# Patient Record
Sex: Male | Born: 1985 | Race: Black or African American | Hispanic: No | Marital: Single | Smoking: Current every day smoker
Health system: Southern US, Community
[De-identification: ages and names within clinical notes are randomized; demographics above are authoritative.]

## PROBLEM LIST (undated history)

## (undated) DIAGNOSIS — J45909 Unspecified asthma, uncomplicated: Secondary | ICD-10-CM

## (undated) DIAGNOSIS — F32A Depression, unspecified: Secondary | ICD-10-CM

## (undated) DIAGNOSIS — F329 Major depressive disorder, single episode, unspecified: Secondary | ICD-10-CM

---

## 2015-07-05 ENCOUNTER — Emergency Department
Admission: EM | Admit: 2015-07-05 | Discharge: 2015-07-05 | Disposition: A | Discharge status: Home / Self Care | Payer: BLUE CROSS/BLUE SHIELD | Attending: Emergency Medicine | Admitting: Emergency Medicine

## 2015-07-05 DIAGNOSIS — Y998 Other external cause status: Secondary | ICD-10-CM | POA: Diagnosis not present

## 2015-07-05 DIAGNOSIS — S29019A Strain of muscle and tendon of unspecified wall of thorax, initial encounter: Secondary | ICD-10-CM

## 2015-07-05 DIAGNOSIS — X58XXXA Exposure to other specified factors, initial encounter: Secondary | ICD-10-CM | POA: Diagnosis not present

## 2015-07-05 DIAGNOSIS — Y9289 Other specified places as the place of occurrence of the external cause: Secondary | ICD-10-CM | POA: Insufficient documentation

## 2015-07-05 DIAGNOSIS — S29002A Unspecified injury of muscle and tendon of back wall of thorax, initial encounter: Secondary | ICD-10-CM | POA: Diagnosis present

## 2015-07-05 DIAGNOSIS — Y9389 Activity, other specified: Secondary | ICD-10-CM | POA: Insufficient documentation

## 2015-07-05 DIAGNOSIS — S29012A Strain of muscle and tendon of back wall of thorax, initial encounter: Secondary | ICD-10-CM | POA: Insufficient documentation

## 2015-07-05 LAB — CBC WITH DIFFERENTIAL/PLATELET
BASOS ABS: 0.1 10*3/uL (ref 0–0.1)
BASOS PCT: 1 %
EOS ABS: 0.2 10*3/uL (ref 0–0.7)
EOS PCT: 2 %
HCT: 42.8 % (ref 40.0–52.0)
HEMOGLOBIN: 14.1 g/dL (ref 13.0–18.0)
LYMPHS PCT: 14 %
Lymphs Abs: 1.3 10*3/uL (ref 1.0–3.6)
MCH: 29.1 pg (ref 26.0–34.0)
MCHC: 33.1 g/dL (ref 32.0–36.0)
MCV: 87.9 fL (ref 80.0–100.0)
MONO ABS: 0.7 10*3/uL (ref 0.2–1.0)
MONOS PCT: 7 %
NEUTROS ABS: 7.1 10*3/uL — AB (ref 1.4–6.5)
NEUTROS PCT: 76 %
PLATELETS: 238 10*3/uL (ref 150–440)
RBC: 4.87 MIL/uL (ref 4.40–5.90)
RDW: 13.8 % (ref 11.5–14.5)
WBC: 9.5 10*3/uL (ref 3.8–10.6)

## 2015-07-05 LAB — BASIC METABOLIC PANEL
Anion gap: 4 — ABNORMAL LOW (ref 5–15)
BUN: 10 mg/dL (ref 6–20)
CHLORIDE: 107 mmol/L (ref 101–111)
CO2: 27 mmol/L (ref 22–32)
CREATININE: 0.85 mg/dL (ref 0.61–1.24)
Calcium: 8.9 mg/dL (ref 8.9–10.3)
GFR calc non Af Amer: >60 mL/min (ref >60)
Glucose, Bld: 108 mg/dL — ABNORMAL HIGH (ref 65–99)
POTASSIUM: 3.8 mmol/L (ref 3.5–5.1)
Sodium: 138 mmol/L (ref 135–145)

## 2015-07-05 LAB — TROPONIN I

## 2015-07-05 LAB — CK: Total CK: 141 U/L (ref 49–397)

## 2015-07-05 MED ORDER — OXYCODONE-ACETAMINOPHEN 5-325 MG PO TABS: 1.0000 | ORAL_TABLET | ORAL | Status: DC | PRN

## 2015-07-05 MED ORDER — OXYCODONE-ACETAMINOPHEN 5-325 MG PO TABS
1.0000 | ORAL_TABLET | Freq: Once | ORAL | Status: AC
  Administered 2015-07-05: 1 via ORAL
  Filled 2015-07-05: qty 1

## 2015-07-05 MED ORDER — DIAZEPAM 2 MG PO TABS
2.0000 mg | ORAL_TABLET | Freq: Once | ORAL | Status: AC
  Administered 2015-07-05: 2 mg via ORAL
  Filled 2015-07-05: qty 1

## 2015-07-05 MED ORDER — DIAZEPAM 2 MG PO TABS: 2.0000 mg | ORAL_TABLET | Freq: Three times a day (TID) | ORAL | Status: DC | PRN

## 2015-07-05 MED ORDER — KETOROLAC TROMETHAMINE 30 MG/ML IJ SOLN
60.0000 mg | Freq: Once | INTRAMUSCULAR | Status: AC
  Administered 2015-07-05: 60 mg via INTRAMUSCULAR
  Filled 2015-07-05: qty 2

## 2015-07-05 NOTE — ED Notes (Signed)
Pt reports back pain since Saturday. He states that he went to hospital in [facility] yesterday and was given pain meds. Awakened at 2AM with severe pain. Describes pain as pressure and squeezing, 10/10. Pt alert & oriented, resting quietly. NAD noted.

## 2015-07-05 NOTE — ED Notes (Signed)
Pt in with co back pain for few days,denies injury.  Was seen at [facility] for the same and had negative xrays.  Was given naproxen without relief.

## 2015-07-05 NOTE — Discharge Instructions (Signed)
1. Fill your naproxen prescription. 2. You may take medicines as needed for pain and muscle spasms (Percocet/Valium #10). 3. Return to the ER for worsening symptoms, persistent vomiting, difficulty breathing, numbness/tingling or other concerns.  Thoracic Strain A thoracic strain, which is sometimes called a mid-back strain, is an injury to the muscles or tendons that attach to the upper part of your back behind your chest. This type of injury occurs when a muscle is overstretched or overloaded.  Thoracic strains can range from mild to severe. Mild strains may involve stretching a muscle or tendon without tearing it. These injuries may heal in 1-2 weeks. More severe strains involve tearing of muscle fibers or tendons. These will cause more pain and may take 6-8 weeks to heal. CAUSES This condition may be caused by:  An injury in which a sudden force is placed on the muscle.  Exercising without properly warming up.  Overuse of the muscle.  Improper form during certain movements.  Other injuries that surround or cause stress on the mid-back, causing a strain on the muscles. In some cases, the cause may not be known. RISK FACTORS This injury is more common in:  Athletes.  People with obesity. SYMPTOMS The main symptom of this condition is pain, especially with movement. Other symptoms include:  Bruising.  Swelling.  Spasm. DIAGNOSIS This condition may be diagnosed with a physical exam. X-rays may be taken to check for a fracture. TREATMENT This condition may be treated with:  Resting and icing the injured area.  Physical therapy. This will involve doing stretching and strengthening exercises.  Medicines for pain and inflammation. HOME CARE INSTRUCTIONS  Rest as needed. Follow instructions from your health care provider about any restrictions on activity.  If directed, apply ice to the injured area:  Put ice in a plastic bag.  Place a towel between your skin and the  bag.  Leave the ice on for 20 minutes, 2-3 times per day.  Take over-the-counter and prescription medicines only as told by your health care provider.  Begin doing exercises as told by your health care provider or physical therapist.  Always warm up properly before physical activity or sports.  Bend your knees before you lift heavy objects.  Keep all follow-up visits as told by your health care provider. This is important. SEEK MEDICAL CARE IF:  Your pain is not helped by medicine.  Your pain, bruising, or swelling is getting worse.  You have a fever. SEEK IMMEDIATE MEDICAL CARE IF:  You have shortness of breath.  You have chest pain.  You develop numbness or weakness in your legs.  You have involuntary loss of urine (urinary incontinence).   This information is not intended to replace advice given to you by your health care provider. Make sure you discuss any questions you have with your health care provider.   Document Released: 07/13/2003 Document Revised: 01/11/2015 Document Reviewed: 06/16/2014 Elsevier Interactive Patient Education [employer]

## 2015-07-05 NOTE — ED Notes (Addendum)
Pt discharged home after verbalizing understanding of discharge instructions; nad noted.  Pt did not drive; to be picked up by family member

## 2015-07-05 NOTE — ED Provider Notes (Signed)
[facility] Emergency Department Provider Note  ____________________________________________  Time seen: Approximately 6:41 AM  I have reviewed the triage vital signs and the nursing notes.   HISTORY  Chief Complaint Back Pain    HPI Roger Hall is a 30 y.o. male who presents to the ED from home with a chief complaint of upper back pain. Patient complains of a 4 to 5 day history ofthoracic back pain. Denies recent travel or trauma. He was seen at [facility] yesterday morning for the same complaint. States he had x-rays and given a shot of pain medicine and prescribed naproxen. Has not yet filled this prescription. He was unable to sleep this morning secondary to pain and muscle spasms in his upper back. Symptoms are not associated with numbness/tingling, arm weakness, chest pain, shortness of breath. Also denies bowel or bladder incontinence. Denies recent fever, chills, cough, congestion, abdominal pain, nausea, vomiting, diarrhea. Nothing makes his pain better. Movement makes his pain worse. He did note last week that his hands and fingers "drawled up" and it took a few minutes for them to relax. He does admit to cocaine use; none in the past 24 hours. Denies hormone use.  Past medical history None   There are no active problems to display for this patient.   No past surgical history on file.  No current outpatient prescriptions on file.  Allergies Review of patient's allergies indicates no known allergies.  No family history on file.  Social History Social History  Substance Use Topics  . Smoking status: Not on file  . Smokeless tobacco: Not on file  . Alcohol Use: Not on file  Smoker  Cocaine use  Review of Systems  Constitutional: No fever/chills. Eyes: No visual changes. ENT: No sore throat. Cardiovascular: Denies chest pain. Respiratory: Denies shortness of breath. Gastrointestinal: No abdominal pain.  No nausea, no vomiting.   No diarrhea.  No constipation. Genitourinary: Negative for dysuria. Musculoskeletal:  Positive for back pain. Skin: Negative for rash. Neurological: Negative for headaches, focal weakness or numbness.  10-point ROS otherwise negative.  ____________________________________________   PHYSICAL EXAM:  VITAL SIGNS: ED Triage Vitals  Enc Vitals Group     BP 07/05/15 0318 120/73 mmHg     Pulse Rate 07/05/15 0318 79     Resp 07/05/15 0318 18     Temp 07/05/15 0318 98.2 F (36.8 C)     Temp Source 07/05/15 0318 Oral     SpO2 07/05/15 0318 100 %     Weight 07/05/15 0318 160 lb (72.576 kg)     Height 07/05/15 0318  (1.6 m)     Head Cir --      Peak Flow --      Pain Score 07/05/15 0318 10     Pain Loc --      Pain Edu? --      Excl. in GC? --     Constitutional: Asleep, easily awakened for exam. Alert and oriented. Well appearing and in no acute distress. Eyes: Conjunctivae are normal. PERRL. EOMI. Head: Atraumatic. Nose: No congestion/rhinnorhea. Mouth/Throat: Mucous membranes are moist.  Oropharynx non-erythematous. Neck: No stridor.  No cervical spine tenderness to palpation. Cardiovascular: Normal rate, regular rhythm. Grossly normal heart sounds.  Good peripheral circulation. Respiratory: Normal respiratory effort.  No retractions. Lungs CTAB. Gastrointestinal: Soft and nontender. No distention. No abdominal bruits. No CVA tenderness. Musculoskeletal: No spinal tenderness to palpation. Thoracic paraspinal muscle spasms, right greater than left. Pain on movement of trunk  and raising right shoulder.  Neurologic:  Normal speech and language. No gross focal neurologic deficits are appreciated. No gait instability. Skin:  Skin is warm, dry and intact. No rash noted. Psychiatric: Mood and affect are normal. Speech and behavior are normal.  ____________________________________________   LABS (all labs ordered are listed, but only abnormal results are displayed)  Labs  Reviewed  BASIC METABOLIC PANEL  CK   ____________________________________________ EKG None ___________________________________________  Radiology None ____________________________________________   PROCEDURES  Procedure(s) performed: None  Critical Care performed: No  ____________________________________________   INITIAL IMPRESSION / ASSESSMENT AND PLAN / ED COURSE  Pertinent labs & imaging results that were available during my care of the patient were reviewed by me and considered in my medical decision making (see chart for details).  30 year old male who presents with thoracic back pain, most likely musculoskeletal in nature. Given patient's complaint of hand cramping last week, coupled with cocaine use, will check electrolytes and ck. Will administer NSAIDs, analgesia and muscle relaxer.   ----------------------------------------- 8:11 AM on 07/05/2015 -----------------------------------------  Patient complained to nurse of chest pain. EKG was obtained which was unremarkable. Given patient's complaints of thoracic back pain, and now chest pain in the setting of cocaine use over 24 hours ago, will add a troponin. If this is negative, and patient's pain is improved, I anticipate he will be able to be discharged home with prescriptions for analgesia and muscle relaxers. I did search the patient on the [place] narcotics database and he does not have any entries. Overall feel patient's symptoms are suggestive of musculoskeletal strain. Low suspicion for ACS, PE, aortic dissection. Care transferred to Dr. Alphonzo Lemmings. ____________________________________________   FINAL CLINICAL IMPRESSION(S) / ED DIAGNOSES  Final diagnoses:  Thoracic myofascial strain, initial encounter      Irean Hong, MD 07/05/15 [phone]

## 2016-04-02 ENCOUNTER — Emergency Department
Admission: EM | Admit: 2016-04-02 | Discharge: 2016-04-02 | Disposition: A | Discharge status: Home / Self Care | Payer: BLUE CROSS/BLUE SHIELD | Attending: Emergency Medicine | Admitting: Emergency Medicine

## 2016-04-02 DIAGNOSIS — F17210 Nicotine dependence, cigarettes, uncomplicated: Secondary | ICD-10-CM | POA: Insufficient documentation

## 2016-04-02 DIAGNOSIS — J039 Acute tonsillitis, unspecified: Secondary | ICD-10-CM | POA: Insufficient documentation

## 2016-04-02 DIAGNOSIS — J069 Acute upper respiratory infection, unspecified: Secondary | ICD-10-CM | POA: Insufficient documentation

## 2016-04-02 HISTORY — DX: Depression, unspecified: F32.A

## 2016-04-02 HISTORY — DX: Major depressive disorder, single episode, unspecified: F32.9

## 2016-04-02 LAB — POCT RAPID STREP A: Streptococcus, Group A Screen (Direct): NEGATIVE

## 2016-04-02 MED ORDER — AMOXICILLIN 875 MG PO TABS: 875.0000 mg | ORAL_TABLET | Freq: Two times a day (BID) | ORAL | 0 refills | Status: DC

## 2016-04-02 MED ORDER — FLUTICASONE PROPIONATE 50 MCG/ACT NA SUSP: 2.0000 | Freq: Every day | NASAL | 0 refills | Status: DC

## 2016-04-02 MED ORDER — PSEUDOEPH-BROMPHEN-DM 30-2-10 MG/5ML PO SYRP: 10.0000 mL | ORAL_SOLUTION | Freq: Four times a day (QID) | ORAL | 0 refills | Status: DC | PRN

## 2016-04-02 NOTE — ED Triage Notes (Signed)
Pt c/o sore throat with bodyaches and sinus congestion since saturday

## 2016-04-02 NOTE — ED Provider Notes (Signed)
[facility] Emergency Department Provider Note  ____________________________________________  Time seen: Approximately 9:00 AM  I have reviewed the triage vital signs and the nursing notes.   HISTORY  Chief Complaint Sore Throat    HPI Roger Hall is a 30 y.o. male , NAD, presents to the nurse department with 2 day history of sore throat, ear pain, fatigue and body aches. Patient states symptoms began suddenly over the weekend.Has had increasing sore throat and feeling of swelling about his tonsils over the last 24 hours. Has not had any difficulty breathing or swallowing. Has had no shortness of breath or wheezing. Has not taken anything over-the-counter for his symptoms. Denies any sick contacts. Has had no chest pain, abdominal pain, nausea or vomiting. Denies drainage from ears but has had nasal congestion and runny nose with sinus headache.   Past Medical History:  Diagnosis Date  . Depression     There are no active problems to display for this patient.   History reviewed. No pertinent surgical history.  Prior to Admission medications   Medication Sig Start Date End Date Taking? Authorizing Provider  amoxicillin (AMOXIL) 875 MG tablet Take 1 tablet (875 mg total) by mouth 2 (two) times daily. 04/02/16   Jami L Hagler, PA-C  brompheniramine-pseudoephedrine-DM 30-2-10 MG/5ML syrup Take 10 mLs by mouth 4 (four) times daily as needed. 04/02/16   Jami L Hagler, PA-C  fluticasone (FLONASE) 50 MCG/ACT nasal spray Place 2 sprays into both nostrils daily. 04/02/16   Jami L Hagler, PA-C    Allergies Patient has no known allergies.  No family history on file.  Social History Social History  Substance Use Topics  . Smoking status: Current Every Day Smoker    Types: Cigarettes  . Smokeless tobacco: Never Used  . Alcohol use Yes     Review of Systems  Constitutional: Positive fatigue. No fevers, chills Eyes: No discharge ENT: Positive sore  throat, nasal congestion, ear pressure, runny nose. No ear drainage Cardiovascular: No chest pain. Respiratory: Positive nonproductive cough. No shortness of breath. No wheezing.  Gastrointestinal: No abdominal pain.  No nausea, vomiting.  Musculoskeletal: Positive for general myalgias.  Skin: Negative for rash. Neurological: Positive for sinus headaches, but no focal weakness or numbness. 10-point ROS otherwise negative.  ____________________________________________   PHYSICAL EXAM:  VITAL SIGNS: ED Triage Vitals  Enc Vitals Group     BP 04/02/16 0856 129/69     Pulse Rate 04/02/16 0856 78     Resp 04/02/16 0856 17     Temp 04/02/16 0856 98.5 F (36.9 C)     Temp Source 04/02/16 0856 Oral     SpO2 04/02/16 0856 99 %     Weight 04/02/16 0856 145 lb (65.8 kg)     Height 04/02/16 0856 5\' 7"  (1.702 m)     Head Circumference --      Peak Flow --      Pain Score 04/02/16 0857 6     Pain Loc --      Pain Edu? --      Excl. in GC? --      Constitutional: Alert and oriented. Well appearing and in no acute distress. Eyes: Conjunctivae are normal without icterus, injection or discharge. Head: Atraumatic. No tenderness to percussion of frontal and maxillary sinuses. ENT:      Ears: TMs visualized with mild serous effusion but no bulging, erythema or perforation.      Nose: Not congestion with trace rhinorrhea. Mild injection to  the turbinates.      Mouth/Throat: Mucous membranes are moist. Bilateral tonsils with moderate erythema, white exudate and mild swelling but uvula is midline. Posterior pharynx without swelling. Airway is patent. Neck: No stridor. Supple with full range of motion. Hematological/Lymphatic/Immunilogical: No cervical lymphadenopathy. Cardiovascular: Normal rate, regular rhythm. Normal S1 and S2.  Good peripheral circulation. Respiratory: Normal respiratory effort without tachypnea or retractions. Lungs CTAB with breath sounds noted in all lung fields. No wheeze,  rhonchi, rales. Neurologic:  Normal speech and language. No gross focal neurologic deficits are appreciated.  Skin:  Skin is warm, dry and intact. No rash noted. Psychiatric: Mood and affect are normal. Speech and behavior are normal. Patient exhibits appropriate insight and judgement.   ____________________________________________   LABS (all labs ordered are listed, but only abnormal results are displayed)  Labs Reviewed  CULTURE, GROUP A STREP [facility]  POCT RAPID STREP A   ____________________________________________  EKG  None ____________________________________________  RADIOLOGY  None ____________________________________________    PROCEDURES  Procedure(s) performed: None   Procedures   Medications - No data to display   ____________________________________________   INITIAL IMPRESSION / ASSESSMENT AND PLAN / ED COURSE  Pertinent labs & imaging results that were available during my care of the patient were reviewed by me and considered in my medical decision making (see chart for details).  Clinical Course     Patient's diagnosis is consistent with Acute tonsillitis with URI. Patient will be discharged home with prescriptions for amoxicillin, Bromfed-DM and Flonase to take as directed. May take Tylenol or ibuprofen as needed. [facility] if symptoms persist past this treatment course. Patient is given ED precautions to return to the ED for any worsening or new symptoms.    ____________________________________________  FINAL CLINICAL IMPRESSION(S) / ED DIAGNOSES  Final diagnoses:  Acute tonsillitis, unspecified etiology  Upper respiratory tract infection, unspecified type      NEW MEDICATIONS STARTED DURING THIS VISIT:  Discharge Medication List as of 04/02/2016  9:40 AM    START taking these medications   Details  amoxicillin (AMOXIL) 875 MG tablet Take 1 tablet (875 mg total) by mouth 2 (two) times  daily., Starting Tue 04/02/2016, Print    brompheniramine-pseudoephedrine-DM 30-2-10 MG/5ML syrup Take 10 mLs by mouth 4 (four) times daily as needed., Starting Tue 04/02/2016, Print    fluticasone (FLONASE) 50 MCG/ACT nasal spray Place 2 sprays into both nostrils daily., Starting Tue 04/02/2016, Print             Hope PigeonJami L Hagler, PA-C 04/02/16 1035    Sharman CheekPhillip Stafford, MD 04/03/16 [phone]

## 2016-04-02 NOTE — ED Notes (Signed)
Body aches  With fever/chills for 2 days   Afebrile on arrival to ED states is having increased sore throat this am

## 2016-04-03 LAB — CULTURE, GROUP A STREP (THRC)

## 2016-05-16 ENCOUNTER — Encounter: Payer: Self-pay | Admitting: Emergency Medicine

## 2016-05-16 ENCOUNTER — Emergency Department
Admission: EM | Admit: 2016-05-16 | Discharge: 2016-05-16 | Disposition: A | Discharge status: Home / Self Care | Payer: BLUE CROSS/BLUE SHIELD | Attending: Emergency Medicine | Admitting: Emergency Medicine

## 2016-05-16 DIAGNOSIS — M545 Low back pain, unspecified: Secondary | ICD-10-CM

## 2016-05-16 DIAGNOSIS — G8929 Other chronic pain: Secondary | ICD-10-CM | POA: Insufficient documentation

## 2016-05-16 DIAGNOSIS — F17210 Nicotine dependence, cigarettes, uncomplicated: Secondary | ICD-10-CM | POA: Insufficient documentation

## 2016-05-16 LAB — URINALYSIS, COMPLETE (UACMP) WITH MICROSCOPIC
BACTERIA UA: NONE SEEN
BILIRUBIN URINE: NEGATIVE
Glucose, UA: NEGATIVE mg/dL
Hgb urine dipstick: NEGATIVE
Ketones, ur: NEGATIVE mg/dL
LEUKOCYTES UA: NEGATIVE
Nitrite: NEGATIVE
Protein, ur: NEGATIVE mg/dL
Specific Gravity, Urine: 1.014 (ref 1.005–1.030)
pH: 6 (ref 5.0–8.0)

## 2016-05-16 LAB — CHLAMYDIA/NGC RT PCR (ARMC ONLY)
CHLAMYDIA TR: NOT DETECTED
N gonorrhoeae: NOT DETECTED

## 2016-05-16 MED ORDER — MELOXICAM 15 MG PO TABS: 15.0000 mg | ORAL_TABLET | Freq: Every day | ORAL | 0 refills | Status: AC

## 2016-05-16 NOTE — ED Notes (Signed)
See triage note   States he has been having lower back pain for several days w/o injury  Ambulates well to treatment room  Also states he is having some burning with urination

## 2016-05-16 NOTE — ED Provider Notes (Signed)
[facility] Emergency Department Provider Note  ____________________________________________  Time seen: Approximately 10:29 AM  I have reviewed the triage vital signs and the nursing notes.   HISTORY  Chief Complaint Back Pain    HPI Roger Hall is a 31 y.o. male presents to the emergency department with occasional burning with urination and low back pain. Patient states that symptoms started 1 week ago. Patient has a history of chronic back pain and states that his back pain always feels different. Patient states that he does not drink a lot of water. Patient has had urinary tract infections in the past. Patient denies past STDs. Patient is unsure if he was exposed to STD. Patient denies fever, abdominal pain, frequency, urgency, discharge.   Past Medical History:  Diagnosis Date  . Depression     There are no active problems to display for this patient.   History reviewed. No pertinent surgical history.  Prior to Admission medications   Medication Sig Start Date End Date Taking? Authorizing Provider  meloxicam (MOBIC) 15 MG tablet Take 1 tablet (15 mg total) by mouth daily. 05/16/16 05/26/16  Enid Derry, PA-C    Allergies Patient has no known allergies.  No family history on file.  Social History Social History  Substance Use Topics  . Smoking status: Current Every Day Smoker    Types: Cigarettes  . Smokeless tobacco: Never Used  . Alcohol use Yes     Review of Systems  Constitutional: No fever/chills ENT: No upper respiratory complaints. Cardiovascular: No chest pain. Respiratory: No SOB. Gastrointestinal: No abdominal pain.  No nausea, no vomiting.  Musculoskeletal: Negative for musculoskeletal pain. Skin: Negative for rash, abrasions, lacerations, ecchymosis. Neurological: Negative for headaches, numbness or tingling   ____________________________________________   PHYSICAL EXAM:  VITAL SIGNS: ED Triage Vitals  Enc  Vitals Group     BP 05/16/16 0917 133/81     Pulse Rate 05/16/16 0917 82     Resp 05/16/16 0917 18     Temp 05/16/16 0917 98.2 F (36.8 C)     Temp Source 05/16/16 0917 Oral     SpO2 05/16/16 0917 99 %     Weight 05/16/16 0917 145 lb (65.8 kg)     Height --      Head Circumference --      Peak Flow --      Pain Score 05/16/16 0918 3     Pain Loc --      Pain Edu? --      Excl. in GC? --      Constitutional: Alert and oriented. Well appearing and in no acute distress. Eyes: Conjunctivae are normal. PERRL. EOMI. Head: Atraumatic. ENT:      Ears:      Nose: No congestion/rhinnorhea.      Mouth/Throat: Mucous membranes are moist.  Neck: No stridor.   Cardiovascular: Normal rate, regular rhythm. Normal S1 and S2.  Good peripheral circulation. Respiratory: Normal respiratory effort without tachypnea or retractions. Lungs CTAB. Good air entry to the bases with no decreased or absent breath sounds. Gastrointestinal: Bowel sounds 4 quadrants. Soft and nontender to palpation. No guarding or rigidity. No palpable masses. No distention. No CVA tenderness. Musculoskeletal: Full range of motion to all extremities. No gross deformities appreciated. Patient is tender to palpation across the lumbar region. No tenderness to palpation over thoracic or lumbar spine. Neurologic:  Normal speech and language. No gross focal neurologic deficits are appreciated.  Skin:  Skin is warm, dry and  intact. No rash noted. Psychiatric: Mood and affect are normal. Speech and behavior are normal. Patient exhibits appropriate insight and judgement.   ____________________________________________   LABS (all labs ordered are listed, but only abnormal results are displayed)  Labs Reviewed  URINALYSIS, COMPLETE (UACMP) WITH MICROSCOPIC - Abnormal; Notable for the following:       Result Value   Color, Urine YELLOW (*)    APPearance CLEAR (*)    Squamous Epithelial / LPF 0-5 (*)    All other components within  normal limits  CHLAMYDIA/NGC RT PCR ([facility] ONLY)   ____________________________________________  EKG   ____________________________________________  RADIOLOGY   No results found.  ____________________________________________    PROCEDURES  Procedure(s) performed:    Procedures    Medications - No data to display   ____________________________________________   INITIAL IMPRESSION / ASSESSMENT AND PLAN / ED COURSE  Pertinent labs & imaging results that were available during my care of the patient were reviewed by me and considered in my medical decision making (see chart for details).  Review of the [place] CSRS was performed in accordance of the [facility] prior to dispensing any controlled drugs.  Clinical Course     Patient's diagnosis is consistent with chronic low back pain. Urinalysis negative for infection. Gonorrhea and chlamydia negative. Exam and vital signs are reassuring. Patient will be discharged home with prescriptions for meloxicam. Patient is to follow up with PCP as directed. Patient is given ED precautions to return to the ED for any worsening or new symptoms.     ____________________________________________  FINAL CLINICAL IMPRESSION(S) / ED DIAGNOSES  Final diagnoses:  Chronic bilateral low back pain without sciatica      NEW MEDICATIONS STARTED DURING THIS VISIT:  Discharge Medication List as of 05/16/2016 11:48 AM    START taking these medications   Details  meloxicam (MOBIC) 15 MG tablet Take 1 tablet (15 mg total) by mouth daily., Starting Thu 05/16/2016, Until Sun 05/26/2016, Print            This chart was dictated using voice recognition software/Dragon. Despite best efforts to proofread, errors can occur which can change the meaning. Any change was purely unintentional.    Enid DerryAshley Hosam Mcfetridge, PA-C 05/16/16 1317    Jennye MoccasinBrian S Quigley, MD 05/16/16 1321

## 2016-05-16 NOTE — ED Triage Notes (Addendum)
Pt with low back pain for over a week. Hx of chronic back pain.  Pt also wants to be checked for STD>

## 2016-06-24 ENCOUNTER — Emergency Department: Payer: Self-pay

## 2016-06-24 ENCOUNTER — Encounter: Payer: Self-pay | Admitting: *Deleted

## 2016-06-24 ENCOUNTER — Emergency Department
Admission: EM | Admit: 2016-06-24 | Discharge: 2016-06-24 | Disposition: A | Discharge status: Home / Self Care | Payer: Self-pay | Attending: Emergency Medicine | Admitting: Emergency Medicine

## 2016-06-24 DIAGNOSIS — F17210 Nicotine dependence, cigarettes, uncomplicated: Secondary | ICD-10-CM | POA: Insufficient documentation

## 2016-06-24 DIAGNOSIS — R202 Paresthesia of skin: Secondary | ICD-10-CM | POA: Insufficient documentation

## 2016-06-24 DIAGNOSIS — R0602 Shortness of breath: Secondary | ICD-10-CM | POA: Insufficient documentation

## 2016-06-24 DIAGNOSIS — R079 Chest pain, unspecified: Secondary | ICD-10-CM

## 2016-06-24 DIAGNOSIS — F3181 Bipolar II disorder: Secondary | ICD-10-CM | POA: Insufficient documentation

## 2016-06-24 DIAGNOSIS — F1910 Other psychoactive substance abuse, uncomplicated: Secondary | ICD-10-CM | POA: Insufficient documentation

## 2016-06-24 LAB — CBC
HEMATOCRIT: 47 % (ref 40.0–52.0)
Hemoglobin: 15.3 g/dL (ref 13.0–18.0)
MCH: 29 pg (ref 26.0–34.0)
MCHC: 32.5 g/dL (ref 32.0–36.0)
MCV: 89.1 fL (ref 80.0–100.0)
Platelets: 225 10*3/uL (ref 150–440)
RBC: 5.27 MIL/uL (ref 4.40–5.90)
RDW: 14.3 % (ref 11.5–14.5)
WBC: 8.2 10*3/uL (ref 3.8–10.6)

## 2016-06-24 LAB — BASIC METABOLIC PANEL
ANION GAP: 7 (ref 5–15)
BUN: 11 mg/dL (ref 6–20)
CO2: 27 mmol/L (ref 22–32)
Calcium: 9.4 mg/dL (ref 8.9–10.3)
Chloride: 104 mmol/L (ref 101–111)
Creatinine, Ser: 0.92 mg/dL (ref 0.61–1.24)
GFR calc Af Amer: >60 mL/min (ref >60)
GLUCOSE: 86 mg/dL (ref 65–99)
POTASSIUM: 3.6 mmol/L (ref 3.5–5.1)
Sodium: 138 mmol/L (ref 135–145)

## 2016-06-24 LAB — TROPONIN I: Troponin I: <0.03 ng/mL (ref <0.03)

## 2016-06-24 MED ORDER — ARIPIPRAZOLE 10 MG PO TABS
ORAL_TABLET | ORAL | Status: AC
  Administered 2016-06-24: 5 mg via ORAL
  Filled 2016-06-24: qty 1

## 2016-06-24 MED ORDER — ARIPIPRAZOLE 5 MG PO TABS
5.0000 mg | ORAL_TABLET | Freq: Once | ORAL | Status: AC
  Administered 2016-06-24: 5 mg via ORAL

## 2016-06-24 MED ORDER — PRAZOSIN HCL 1 MG PO CAPS: 1.0000 mg | ORAL_CAPSULE | Freq: Every day | ORAL | 0 refills | Status: DC

## 2016-06-24 MED ORDER — ARIPIPRAZOLE 5 MG PO TABS: 5.0000 mg | ORAL_TABLET | Freq: Every day | ORAL | 0 refills | Status: DC

## 2016-06-24 MED ORDER — ASPIRIN 81 MG PO CHEW: 81.0000 mg | CHEWABLE_TABLET | Freq: Every day | ORAL | 0 refills | Status: DC

## 2016-06-24 NOTE — ED Notes (Signed)
Pt reports that he was having chest pain and shortness of breath today while he was at work - c/o getting lightheaded and reports speech changes - c/o left arm "feeling funny" - at this time speech clear - continues to c/o chest pain - no shortness of breath observed - respirations are even and unlabored

## 2016-06-24 NOTE — ED Triage Notes (Addendum)
States chest pain for 1 week, states today the chest pain went to his back and felt weak, states SOB, awake and alert, states he used cocaine on 2/16, states he is also under a lot of stress with depression and anxiety

## 2016-06-24 NOTE — ED Provider Notes (Addendum)
[facility] Emergency Department Provider Note  ____________________________________________   First MD Initiated Contact with Patient 06/24/16 1830     (approximate)  I have reviewed the triage vital signs and the nursing notes.   HISTORY  Chief Complaint Chest Pain   HPI Roger Hall is a 31 y.o. male with a history of "manic depression" as well as post traumatic stress disorder and anxiety who is presenting to the emergency department with 1 week of chest pain. He says that the chest pain has been crampy and sharp and was radiating to his back today. He says that it lasted several hours today and was a 10 out of 10. He says the pain was worse when he was bending over to lift parts at work. However, he says that the parts are not heavy. Also complaining of shortness of breath. Denies the pain worsening with deep breathing. Says that over the past week that he is also been increasingly stressed and depressed and often does not have the energy to get out of bed in the morning. However, he denies any suicidal or homicidal ideation or hallucinations. He says that he is also been drinking heavily over the past 3-4 days and use cocaine 3 nights ago by snorting it. He says that it worsened today he felt like he was going to pass out and had numbness to his left side which is now resolved. He says that he has never had this for her episode before when he has been depressed but says that he has had body aches in the past.   Past Medical History:  Diagnosis Date  . Depression     There are no active problems to display for this patient.   History reviewed. No pertinent surgical history.  Prior to Admission medications   Not on File    Allergies Patient has no known allergies.  History reviewed. No pertinent family history.  Social History Social History  Substance Use Topics  . Smoking status: Current Every Day Smoker    Types: Cigarettes  . Smokeless  tobacco: Never Used  . Alcohol use Yes    Review of Systems Constitutional: No fever/chills Eyes: No visual changes. ENT: No sore throat. Cardiovascular:as above Respiratory: as above Gastrointestinal: No abdominal pain.  No nausea, no vomiting.  No diarrhea.  No constipation. Genitourinary: Negative for dysuria. Musculoskeletal: Negative for back pain. Skin: Negative for rash. Neurological: Negative for headaches, focal weakness  10-point ROS otherwise negative.  ____________________________________________   PHYSICAL EXAM:  VITAL SIGNS: ED Triage Vitals  Enc Vitals Group     BP 06/24/16 1817 123/75     Pulse Rate 06/24/16 1817 88     Resp 06/24/16 1817 18     Temp 06/24/16 1817 99.3 F (37.4 C)     Temp Source 06/24/16 1817 Oral     SpO2 06/24/16 1817 100 %     Weight 06/24/16 1816 153 lb (69.4 kg)     Height 06/24/16 1816 5\' 4"  (1.626 m)     Head Circumference --      Peak Flow --      Pain Score 06/24/16 1816 10     Pain Loc --      Pain Edu? --      Excl. in GC? --     Constitutional: Alert and oriented. Well appearing and in no acute distress. Eyes: Conjunctivae are normal. PERRL. EOMI. Head: Atraumatic. Nose: No congestion/rhinnorhea. Mouth/Throat: Mucous membranes are moist.  Neck: No stridor.   Cardiovascular: Normal rate, regular rhythm. Grossly normal heart sounds.  Good peripheral circulation With equal and bilateral radial as well as dorsalis pedis pulses. Chest pain reproducible with palpation over the sternum. Respiratory: Normal respiratory effort.  No retractions. Lungs CTAB. Gastrointestinal: Soft and nontender. No distention.  Musculoskeletal: No lower extremity tenderness nor edema.  No joint effusions. Neurologic:  Normal speech and language. No gross focal neurologic deficits are appreciated. No gait instability. Skin:  Skin is warm, dry and intact. No rash noted. Psychiatric: Slightly depressed mood.    ____________________________________________   LABS (all labs ordered are listed, but only abnormal results are displayed)  Labs Reviewed  BASIC METABOLIC PANEL  CBC  TROPONIN I   ____________________________________________  EKG  ED ECG REPORT I, Dahir Ayer,  Teena Irani, the attending physician, personally viewed and interpreted this ECG.   Date: 06/24/2016  EKG Time: 1817  Rate: 93  Rhythm: normal sinus rhythm  Axis: Rightward axis  Intervals: Normal  ST&T Change: No ST segment elevation or depression. No abnormal T-wave inversion.  ____________________________________________  RADIOLOGY  DG Chest 2 View (Final result)  Result time 06/24/16 18:44:56  Final result by Mitzi Hansen, MD (06/24/16 18:44:56)           Narrative:   CLINICAL DATA: 31 y/o M; chest pain.  EXAM: CHEST 2 VIEW  COMPARISON: None.  FINDINGS: The heart size and mediastinal contours are within normal limits. Both lungs are clear. The visualized skeletal structures are unremarkable.  IMPRESSION: No active cardiopulmonary disease.   Electronically Signed By: Mitzi Hansen M.D. On: 06/24/2016 18:44          ____________________________________________   PROCEDURES  Procedure(s) performed:   Procedures  Critical Care performed:   ____________________________________________   INITIAL IMPRESSION / ASSESSMENT AND PLAN / ED COURSE  Pertinent labs & imaging results that were available during my care of the patient were reviewed by me and considered in my medical decision making (see chart for details).  Patient says that he has been admitted 3 times to the psychiatric unit in the past at [facility]. He says that the last time was one year ago. We will proceed with psychiatric consult at this time as well as TTS consultation. The patient says that he is not on any meds at this time is been on Seroquel the past. Feel the patient's symptoms are likely  related to his depression as well as drinking and not resting/sleeping well at night. However, we will check a CAT scan because of his left-sided symptoms today work. His cardiac workup appears reassuring.    ----------------------------------------- 9:34 PM on 06/24/2016 -----------------------------------------   Patient resting comfortable with this time. Has been evaluated by psychiatry, Dr. Ermalinda Memos, who recommends Abilify 5 mg daily as well as prazosin 1 mg at bedtime. I discussed this plan with the patient who continues to deny any hallucinations, suicidal or homicidal ideation. He'll be following up as an outpatient with [facility]. Possible psychosomatic versus TIA symptoms. We'll start the patient on aspirin. We'll refer to primary care. A symptomatic at this time with a normal head CT.  ____________________________________________   FINAL CLINICAL IMPRESSION(S) / ED DIAGNOSES   Polysubstance abuse. Chest pain. Left sided numbness.    NEW MEDICATIONS STARTED DURING THIS VISIT:  New Prescriptions   No medications on file     Note:  This document was prepared using Dragon voice recognition software and may include unintentional dictation errors.  Also discussed stopping the patient's abuse  including his cocaine and alcohol. He says that he only does cocaine over so often and to be easier for him to stop the cocaine. We discussed the risks of using cocaine with heart attack and stroke. He is understanding willing to comply.    Myrna Blazeravid Matthew Hildegarde Dunaway, MD 06/24/16 2135    Myrna Blazeravid Matthew Jeyli Zwicker, MD 06/24/16 2139

## 2016-07-28 ENCOUNTER — Encounter: Payer: Self-pay | Admitting: Emergency Medicine

## 2016-07-28 ENCOUNTER — Emergency Department
Admission: EM | Admit: 2016-07-28 | Discharge: 2016-07-29 | Disposition: A | Discharge status: Home / Self Care | Payer: BLUE CROSS/BLUE SHIELD | Attending: Emergency Medicine | Admitting: Emergency Medicine

## 2016-07-28 DIAGNOSIS — F17200 Nicotine dependence, unspecified, uncomplicated: Secondary | ICD-10-CM | POA: Diagnosis present

## 2016-07-28 DIAGNOSIS — F1020 Alcohol dependence, uncomplicated: Secondary | ICD-10-CM | POA: Diagnosis present

## 2016-07-28 DIAGNOSIS — F329 Major depressive disorder, single episode, unspecified: Secondary | ICD-10-CM | POA: Insufficient documentation

## 2016-07-28 DIAGNOSIS — F4310 Post-traumatic stress disorder, unspecified: Secondary | ICD-10-CM | POA: Diagnosis present

## 2016-07-28 DIAGNOSIS — F22 Delusional disorders: Secondary | ICD-10-CM

## 2016-07-28 DIAGNOSIS — F32A Depression, unspecified: Secondary | ICD-10-CM

## 2016-07-28 DIAGNOSIS — F17210 Nicotine dependence, cigarettes, uncomplicated: Secondary | ICD-10-CM | POA: Insufficient documentation

## 2016-07-28 DIAGNOSIS — Z5181 Encounter for therapeutic drug level monitoring: Secondary | ICD-10-CM | POA: Insufficient documentation

## 2016-07-28 LAB — URINALYSIS, COMPLETE (UACMP) WITH MICROSCOPIC
Bacteria, UA: NONE SEEN
Bilirubin Urine: NEGATIVE
Glucose, UA: NEGATIVE mg/dL
Hgb urine dipstick: NEGATIVE
Ketones, ur: NEGATIVE mg/dL
Leukocytes, UA: NEGATIVE
Nitrite: NEGATIVE
PH: 8 (ref 5.0–8.0)
Protein, ur: NEGATIVE mg/dL
RBC / HPF: NONE SEEN RBC/hpf (ref 0–5)
SPECIFIC GRAVITY, URINE: 1.005 (ref 1.005–1.030)
SQUAMOUS EPITHELIAL / LPF: NONE SEEN
WBC, UA: NONE SEEN WBC/hpf (ref 0–5)

## 2016-07-28 LAB — CBC WITH DIFFERENTIAL/PLATELET
BASOS PCT: 1 %
Basophils Absolute: 0.1 10*3/uL (ref 0–0.1)
EOS PCT: 2 %
Eosinophils Absolute: 0.1 10*3/uL (ref 0–0.7)
HEMATOCRIT: 45.4 % (ref 40.0–52.0)
Hemoglobin: 15.2 g/dL (ref 13.0–18.0)
Lymphocytes Relative: 18 %
Lymphs Abs: 1.3 10*3/uL (ref 1.0–3.6)
MCH: 29.6 pg (ref 26.0–34.0)
MCHC: 33.5 g/dL (ref 32.0–36.0)
MCV: 88.4 fL (ref 80.0–100.0)
MONO ABS: 0.6 10*3/uL (ref 0.2–1.0)
MONOS PCT: 9 %
NEUTROS ABS: 5 10*3/uL (ref 1.4–6.5)
Neutrophils Relative %: 70 %
PLATELETS: 190 10*3/uL (ref 150–440)
RBC: 5.13 MIL/uL (ref 4.40–5.90)
RDW: 14.3 % (ref 11.5–14.5)
WBC: 7.1 10*3/uL (ref 3.8–10.6)

## 2016-07-28 LAB — URINE DRUG SCREEN, QUALITATIVE (ARMC ONLY)
AMPHETAMINES, UR SCREEN: NOT DETECTED
BARBITURATES, UR SCREEN: NOT DETECTED
BENZODIAZEPINE, UR SCRN: NOT DETECTED
COCAINE METABOLITE, UR ~~LOC~~: NOT DETECTED
Cannabinoid 50 Ng, Ur ~~LOC~~: NOT DETECTED
MDMA (ECSTASY) UR SCREEN: NOT DETECTED
METHADONE SCREEN, URINE: NOT DETECTED
Opiate, Ur Screen: NOT DETECTED
Phencyclidine (PCP) Ur S: NOT DETECTED
TRICYCLIC, UR SCREEN: NOT DETECTED

## 2016-07-28 LAB — COMPREHENSIVE METABOLIC PANEL
ALBUMIN: 4.1 g/dL (ref 3.5–5.0)
ALK PHOS: 42 U/L (ref 38–126)
ALT: 16 U/L — AB (ref 17–63)
AST: 20 U/L (ref 15–41)
Anion gap: 6 (ref 5–15)
BILIRUBIN TOTAL: 1 mg/dL (ref 0.3–1.2)
BUN: 12 mg/dL (ref 6–20)
CO2: 29 mmol/L (ref 22–32)
CREATININE: 1.1 mg/dL (ref 0.61–1.24)
Calcium: 9.5 mg/dL (ref 8.9–10.3)
Chloride: 102 mmol/L (ref 101–111)
GFR calc Af Amer: >60 mL/min (ref >60)
GLUCOSE: 81 mg/dL (ref 65–99)
Potassium: 4 mmol/L (ref 3.5–5.1)
Sodium: 137 mmol/L (ref 135–145)
TOTAL PROTEIN: 7.3 g/dL (ref 6.5–8.1)

## 2016-07-28 LAB — ETHANOL

## 2016-07-28 LAB — ACETAMINOPHEN LEVEL

## 2016-07-28 LAB — SALICYLATE LEVEL

## 2016-07-28 LAB — TSH: TSH: 0.743 u[IU]/mL (ref 0.350–4.500)

## 2016-07-28 MED ORDER — ACETAMINOPHEN 325 MG PO TABS
650.0000 mg | ORAL_TABLET | Freq: Four times a day (QID) | ORAL | Status: DC | PRN
  Administered 2016-07-28: 650 mg via ORAL
  Filled 2016-07-28: qty 2

## 2016-07-28 NOTE — ED Notes (Signed)
BEHAVIORAL HEALTH ROUNDING Patient sleeping: No. Patient alert and oriented: yes Behavior appropriate: Yes.  ; If no, describe:  Nutrition and fluids offered: yes Toileting and hygiene offered: Yes  Sitter present: q15 minute observations and security  monitoring Law enforcement present: Yes  ODS  

## 2016-07-28 NOTE — ED Provider Notes (Signed)
-----------------------------------------   10:42 PM on 07/28/2016 -----------------------------------------  Psychiatry on call recommends inpatient treatment and also feels that he does meet criteria for involuntary commitment and recommends placing him under IVC if he tries to leave.  At this point the patient does want treatment.  We will see if we can get him downstairs behavioral medicine.  The patient's nurse is checking with TTS.   Loleta Roseory Laurier Jasperson, MD 07/28/16 [phone]

## 2016-07-28 NOTE — ED Triage Notes (Signed)
Pt reports body aches and fatigue  For the last few weeks, pt denies fever

## 2016-07-28 NOTE — ED Notes (Signed)
BEHAVIORAL HEALTH ROUNDING  Patient sleeping: No.  Patient alert and oriented: yes  Behavior appropriate: Yes. ; If no, describe:  Nutrition and fluids offered: Yes  Toileting and hygiene offered: Yes  Sitter present: not applicable, Q 15 min safety rounds and observation.  Law enforcement present: Yes ODS  

## 2016-07-28 NOTE — ED Notes (Signed)
Report given to this Clinical research associatewriter by Silvano RuskAnn RN.  Patient to be placed in BHU 5.

## 2016-07-28 NOTE — ED Notes (Signed)
BEHAVIORAL HEALTH ROUNDING Patient sleeping: Yes.   Patient alert and oriented: eyes closed  Appears to be asleep Behavior appropriate: Yes.  ; If no, describe:  Nutrition and fluids offered: Yes  Toileting and hygiene offered: sleeping Sitter present: q 15 minute observations and security monitoring Law enforcement present: yes  ODS 

## 2016-07-28 NOTE — ED Notes (Signed)

## 2016-07-28 NOTE — ED Notes (Signed)
[facility]

## 2016-07-28 NOTE — ED Notes (Signed)
ENVIRONMENTAL ASSESSMENT  Potentially harmful objects out of patient reach: Yes.  Personal belongings secured: Yes.  Patient dressed in hospital provided attire only: Yes.  Plastic bags out of patient reach: Yes.  Patient care equipment (cords, cables, call bells, lines, and drains) shortened, removed, or accounted for: Yes.  Equipment and supplies removed from bottom of stretcher: Yes.  Potentially toxic materials out of patient reach: Yes.  Sharps container removed or out of patient reach: Yes.   BEHAVIORAL HEALTH ROUNDING  Patient sleeping: No.  Patient alert and oriented: yes  Behavior appropriate: Yes. ; If no, describe:  Nutrition and fluids offered: Yes  Toileting and hygiene offered: Yes  Sitter present: not applicable, Q 15 min safety rounds and observation.  Law enforcement present: Yes ODS  ED BHU PLACEMENT JUSTIFICATION  Is the patient under IVC or is there intent for IVC: No.  Is the patient medically cleared: Yes.  Is there vacancy in the ED BHU: Yes.  Is the population mix appropriate for patient: Yes.  Is the patient awaiting placement in inpatient or outpatient setting: Pending psych evaluation.  Has the patient had a psychiatric consult: Pending SOC at this time.  Survey of unit performed for contraband, proper placement and condition of furniture, tampering with fixtures in bathroom, shower, and each patient room: Yes. ; Findings: All clear  APPEARANCE/BEHAVIOR  calm, cooperative and adequate rapport can be established  NEURO ASSESSMENT  Orientation: time, place and person  Hallucinations: No.None noted (Hallucinations)  Speech: Normal  Gait: normal  RESPIRATORY ASSESSMENT  WNL  CARDIOVASCULAR ASSESSMENT  WNL  GASTROINTESTINAL ASSESSMENT  WNL  EXTREMITIES  WNL  PLAN OF CARE  Provide calm/safe environment. Vital signs assessed TID. ED BHU Assessment once each 12-hour shift. Collaborate with TTS daily or as condition indicates. Assure the ED provider has  rounded once each shift. Provide and encourage hygiene. Provide redirection as needed. Assess for escalating behavior; address immediately and inform ED provider.  Assess family dynamic and appropriateness for visitation as needed: Yes. ; If necessary, describe findings:  Educate the patient/family about BHU procedures/visitation: Yes. ; If necessary, describe findings: Pt is calm and cooperative at this time. Pt understanding and accepting of unit procedures/rules. Will continue to monitor with Q 15 min safety rounds and observation.

## 2016-07-28 NOTE — ED Notes (Signed)
[facility]
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
## 2016-07-28 NOTE — ED Provider Notes (Signed)
[facility] Emergency Department Provider Note  ____________________________________________   I have reviewed the triage vital signs and the nursing notes.   HISTORY  Chief Complaint Fatigue and Generalized Body Aches    HPI Roger Hall is a 31 y.o. male suffers from depression PTSD and had a priorsuicide attempt in the past states that over the last 3 weeks he has had anhedonia, decreased interest in doing things he is either sleeping all the time or he is having trouble sleeping, he is not hearing voices but he does get paranoid and he has had thoughts of hurting himself as recently as 3 days ago. That time he is taking by doing an overdose. He has not however taken an overdose. The patient states that he is worried that perhaps she has diabetes mother physical illness but his mother is worried about him and his depression. Patient did not volunteer this information about depression until he was asked but that he states he is very depressed. He is not able to stay with a 29-year-old son. Patient tearful and the exam. He is here therefore voluntarily for further assessment     Past Medical History:  Diagnosis Date  . Depression     There are no active problems to display for this patient.   History reviewed. No pertinent surgical history.  Prior to Admission medications   Medication Sig Start Date End Date Taking? Authorizing Provider  ARIPiprazole (ABILIFY) 5 MG tablet Take 1 tablet (5 mg total) by mouth daily. 06/24/16   Myrna Blazeravid Matthew Schaevitz, MD  aspirin (ASPIRIN CHILDRENS) 81 MG chewable tablet Chew 1 tablet (81 mg total) by mouth daily. 06/24/16 06/24/17  Myrna Blazeravid Matthew Schaevitz, MD  prazosin (MINIPRESS) 1 MG capsule Take 1 capsule (1 mg total) by mouth at bedtime. 06/24/16   Myrna Blazeravid Matthew Schaevitz, MD    Allergies Patient has no known allergies.  No family history on file.  Social History Social History  Substance Use Topics  . Smoking  status: Current Every Day Smoker    Types: Cigarettes  . Smokeless tobacco: Never Used  . Alcohol use Yes    Review of Systems Constitutional: No fever/chills Eyes: No visual changes. ENT: No sore throat. No stiff neck no neck pain Cardiovascular: Denies chest pain. Respiratory: Denies shortness of breath. Gastrointestinal:   no vomiting.  No diarrhea.  No constipation. Genitourinary: Negative for dysuria. Musculoskeletal: Negative lower extremity swelling Skin: Negative for rash. Neurological: Negative for severe headaches, focal weakness or numbness. 10-point ROS otherwise negative.  ____________________________________________   PHYSICAL EXAM:  VITAL SIGNS: ED Triage Vitals  Enc Vitals Group     BP 07/28/16 1249 119/66     Pulse Rate 07/28/16 1249 81     Resp 07/28/16 1249 18     Temp 07/28/16 1249 98.6 F (37 C)     Temp Source 07/28/16 1249 Oral     SpO2 07/28/16 1249 100 %     Weight 07/28/16 1247 153 lb (69.4 kg)     Height 07/28/16 1247 5\' 4"  (1.626 m)     Head Circumference --      Peak Flow --      Pain Score --      Pain Loc --      Pain Edu? --      Excl. in GC? --     Constitutional: Alert and oriented. Well appearing and in no acute distress. Eyes: Conjunctivae are normal. PERRL. EOMI. Head: Atraumatic. Nose: No congestion/rhinnorhea. Mouth/Throat: Mucous  membranes are moist.  Oropharynx non-erythematous. Neck: No stridor.   Nontender with no meningismus Cardiovascular: Normal rate, regular rhythm. Grossly normal heart sounds.  Good peripheral circulation. Respiratory: Normal respiratory effort.  No retractions. Lungs CTAB. Abdominal: Soft and nontender. No distention. No guarding no rebound Back:  There is no focal tenderness or step off.  there is no midline tenderness there are no lesions noted. there is no CVA tenderness Musculoskeletal: No lower extremity tenderness, no upper extremity tenderness. No joint effusions, no DVT signs strong distal  pulses no edema Neurologic:  Normal speech and language. No gross focal neurologic deficits are appreciated.  Skin:  Skin is warm, dry and intact. No rash noted. Psychiatric: Mood and affect are Very depressed. Speech and behavior are normal.  ____________________________________________   LABS (all labs ordered are listed, but only abnormal results are displayed)  Labs Reviewed  BASIC METABOLIC PANEL  COMPREHENSIVE METABOLIC PANEL  ETHANOL  CBC WITH DIFFERENTIAL/PLATELET  URINALYSIS, COMPLETE (UACMP) WITH MICROSCOPIC  URINE DRUG SCREEN, QUALITATIVE ([facility] ONLY)  TSH  ACETAMINOPHEN LEVEL  SALICYLATE LEVEL   ____________________________________________  EKG  I personally interpreted any EKGs ordered by me or triage  ____________________________________________  RADIOLOGY  I reviewed any imaging ordered by me or triage that were performed during my shift and, if possible, patient and/or family made aware of any abnormal findings. ____________________________________________   PROCEDURES  Procedure(s) performed: None  Procedures  Critical Care performed: None  ____________________________________________   INITIAL IMPRESSION / ASSESSMENT AND PLAN / ED COURSE  Pertinent labs & imaging results that were available during my care of the patient were reviewed by me and considered in my medical decision making (see chart for details).  Patient sent to the macular area with generalized symptoms of malaise however he is actually suffering from pre-significant depression with suicidal thoughts a few days ago. Patient would benefit from psychiatric evaluation. We'll transfer therefore to the other side of the hospital emergency department where he can receive care compensatory to his pathology. At this time a low suspicion that is innate or organic pathology of a physical nature. More likely is depression causing all of his symptoms. Patient also has alcohol abuse. No evidence of  withdrawal.  Dr. Lamont Snowball assumes care of pt at this time.     ____________________________________________   FINAL CLINICAL IMPRESSION(S) / ED DIAGNOSES  Final diagnoses:  None      This chart was dictated using voice recognition software.  Despite best efforts to proofread,  errors can occur which can change meaning.      Jeanmarie Plant, MD 07/28/16 1331

## 2016-07-28 NOTE — ED Notes (Signed)
Report given to Amy T.

## 2016-07-29 ENCOUNTER — Encounter: Payer: Self-pay | Admitting: Psychiatry

## 2016-07-29 ENCOUNTER — Inpatient Hospital Stay
Admission: RE | Admit: 2016-07-29 | Discharge: 2016-08-02 | DRG: 885 | Disposition: A | Discharge status: Home / Self Care | Payer: No Typology Code available for payment source | Source: Intra-hospital | Attending: Psychiatry | Admitting: Psychiatry

## 2016-07-29 DIAGNOSIS — F4310 Post-traumatic stress disorder, unspecified: Secondary | ICD-10-CM | POA: Diagnosis present

## 2016-07-29 DIAGNOSIS — F17210 Nicotine dependence, cigarettes, uncomplicated: Secondary | ICD-10-CM | POA: Diagnosis present

## 2016-07-29 DIAGNOSIS — F314 Bipolar disorder, current episode depressed, severe, without psychotic features: Secondary | ICD-10-CM | POA: Diagnosis present

## 2016-07-29 DIAGNOSIS — F1020 Alcohol dependence, uncomplicated: Secondary | ICD-10-CM | POA: Diagnosis present

## 2016-07-29 DIAGNOSIS — F17200 Nicotine dependence, unspecified, uncomplicated: Secondary | ICD-10-CM | POA: Diagnosis present

## 2016-07-29 DIAGNOSIS — Z9119 Patient's noncompliance with other medical treatment and regimen: Secondary | ICD-10-CM

## 2016-07-29 DIAGNOSIS — F22 Delusional disorders: Secondary | ICD-10-CM | POA: Diagnosis present

## 2016-07-29 DIAGNOSIS — G4700 Insomnia, unspecified: Secondary | ICD-10-CM | POA: Diagnosis present

## 2016-07-29 DIAGNOSIS — F410 Panic disorder [episodic paroxysmal anxiety] without agoraphobia: Secondary | ICD-10-CM | POA: Diagnosis present

## 2016-07-29 DIAGNOSIS — R45851 Suicidal ideations: Secondary | ICD-10-CM | POA: Diagnosis present

## 2016-07-29 DIAGNOSIS — Z818 Family history of other mental and behavioral disorders: Secondary | ICD-10-CM

## 2016-07-29 DIAGNOSIS — F333 Major depressive disorder, recurrent, severe with psychotic symptoms: Secondary | ICD-10-CM

## 2016-07-29 MED ORDER — PRAZOSIN HCL 1 MG PO CAPS: 1.0000 mg | ORAL_CAPSULE | Freq: Every day | ORAL | Status: DC

## 2016-07-29 MED ORDER — ACETAMINOPHEN 325 MG PO TABS
650.0000 mg | ORAL_TABLET | Freq: Four times a day (QID) | ORAL | Status: DC | PRN
  Administered 2016-07-31: 650 mg via ORAL
  Filled 2016-07-29: qty 2

## 2016-07-29 MED ORDER — MAGNESIUM HYDROXIDE 400 MG/5ML PO SUSP: 30.0000 mL | Freq: Every day | ORAL | Status: DC | PRN

## 2016-07-29 MED ORDER — PRAZOSIN HCL 1 MG PO CAPS
1.0000 mg | ORAL_CAPSULE | Freq: Every day | ORAL | Status: DC
  Administered 2016-07-29: 1 mg via ORAL
  Filled 2016-07-29 (×3): qty 1

## 2016-07-29 MED ORDER — ARIPIPRAZOLE 5 MG PO TABS
5.0000 mg | ORAL_TABLET | Freq: Every day | ORAL | Status: DC
  Administered 2016-07-30 – 2016-08-02 (×4): 5 mg via ORAL
  Filled 2016-07-29 (×4): qty 1

## 2016-07-29 MED ORDER — PRAZOSIN HCL 1 MG PO CAPS
1.0000 mg | ORAL_CAPSULE | Freq: Two times a day (BID) | ORAL | Status: DC
  Administered 2016-07-30 – 2016-08-02 (×6): 1 mg via ORAL
  Filled 2016-07-29 (×7): qty 1

## 2016-07-29 MED ORDER — OXCARBAZEPINE 300 MG PO TABS
300.0000 mg | ORAL_TABLET | Freq: Two times a day (BID) | ORAL | Status: DC
  Administered 2016-07-29 – 2016-08-02 (×8): 300 mg via ORAL
  Filled 2016-07-29 (×8): qty 1

## 2016-07-29 MED ORDER — HYDROXYZINE HCL 50 MG PO TABS
50.0000 mg | ORAL_TABLET | Freq: Three times a day (TID) | ORAL | Status: DC | PRN
  Administered 2016-07-30: 50 mg via ORAL
  Filled 2016-07-29: qty 1

## 2016-07-29 MED ORDER — NICOTINE 21 MG/24HR TD PT24
21.0000 mg | MEDICATED_PATCH | Freq: Every day | TRANSDERMAL | Status: DC
  Administered 2016-07-30 – 2016-08-02 (×4): 21 mg via TRANSDERMAL
  Filled 2016-07-29 (×4): qty 1

## 2016-07-29 MED ORDER — TRAZODONE HCL 100 MG PO TABS
100.0000 mg | ORAL_TABLET | Freq: Every day | ORAL | Status: DC
  Administered 2016-07-29 – 2016-07-31 (×3): 100 mg via ORAL
  Filled 2016-07-29 (×4): qty 1

## 2016-07-29 MED ORDER — ARIPIPRAZOLE 5 MG PO TABS: 5.0000 mg | ORAL_TABLET | Freq: Every day | ORAL | Status: DC

## 2016-07-29 NOTE — BH Assessment (Signed)
[facility]. Spoke with Attending Physician, Dr. Jennet MaduroPucilowska, she will put the admission orders in. Attending Physician will be Dr. Jennet MaduroPucilowska.   [facility] [place].   Intake Paper Work has been signed and placed on patient chart.  ER staff is aware of the admission Rivka Barbara(Glenda, ER Sect.; Dr. Sharma CovertNorman, ER MD; Irish Lackuthie, Patient's Nurse & Josiah Lobolarissa, Patient Access).

## 2016-07-29 NOTE — ED Notes (Signed)

## 2016-07-29 NOTE — ED Provider Notes (Signed)
-----------------------------------------   8:00 AM on 07/29/2016 -----------------------------------------   Blood pressure 100/71, pulse 70, temperature 97.7 F (36.5 C), temperature source Oral, resp. rate 16, height 5\' 4"  (1.626 m), weight 153 lb (69.4 kg), SpO2 100 %.  The patient had no acute events since last update.  Calm and cooperative at this time.  Disposition is pending Psychiatry/Behavioral Medicine team recommendations.     Willy EddyPatrick Jasmia Angst, MD 07/29/16 0800

## 2016-07-29 NOTE — ED Notes (Signed)
Pt. To BHU from ED ambulatory without difficulty, to room  BHU5. Report from Ann RN. Pt. Is alert and oriented, warm and dry in no distress. Pt. Denies SI, HI, and AVH. Pt. Calm and cooperative. Pt. Made aware of security cameras and Q15 minute rounds. Pt. Encouraged to let Nursing staff know of any concerns or needs.   

## 2016-07-29 NOTE — ED Notes (Signed)
BEHAVIORAL HEALTH ROUNDING  Patient sleeping: No.  Patient alert and oriented: yes  Behavior appropriate: Yes. ; If no, describe:  Nutrition and fluids offered: Yes  Toileting and hygiene offered: Yes  Sitter present: not applicable, Q 15 min safety rounds and observation.  Law enforcement present: Yes ODS  

## 2016-07-29 NOTE — ED Notes (Signed)
ED BHU PLACEMENT JUSTIFICATION Is the patient under IVC or is there intent for IVC: No. Is the patient medically cleared: Yes.   Is there vacancy in the ED BHU: Yes.   Is the population mix appropriate for patient: Yes.   Is the patient awaiting placement in inpatient or outpatient setting: Yes.   Has the patient had a psychiatric consult: Yes.   Survey of unit performed for contraband, proper placement and condition of furniture, tampering with fixtures in bathroom, shower, and each patient room: Yes.  ; Findings: NA APPEARANCE/BEHAVIOR calm, cooperative and adequate rapport can be established NEURO ASSESSMENT Orientation: time, place and person Hallucinations: No.None noted (Hallucinations) Speech: Normal Gait: normal RESPIRATORY ASSESSMENT Normal expansion.  Clear to auscultation.  No rales, rhonchi, or wheezing. CARDIOVASCULAR ASSESSMENT regular rate and rhythm, S1, S2 normal, no murmur, click, rub or gallop GASTROINTESTINAL ASSESSMENT soft, nontender, BS WNL, no r/g EXTREMITIES normal strength, tone, and muscle mass PLAN OF CARE Provide calm/safe environment. Vital signs assessed twice daily. ED BHU Assessment once each 12-hour shift. Collaborate with intake RN daily or as condition indicates. Assure the ED provider has rounded once each shift. Provide and encourage hygiene. Provide redirection as needed. Assess for escalating behavior; address immediately and inform ED provider.  Assess family dynamic and appropriateness for visitation as needed: Yes.  ; If necessary, describe findings: NA Educate the patient/family about BHU procedures/visitation: Yes.  ; If necessary, describe findings: NA  

## 2016-07-29 NOTE — ED Notes (Signed)
Patient made aware of transfer to BMu. Patient in agreement with transfer. Pt. Alert and oriented, warm and dry, in no distress. Pt. Denies SI, HI, and AVH. Pt. Encouraged to let nursing staff know of any concerns or needs.

## 2016-07-29 NOTE — Consult Note (Signed)
[facility] Psychiatry Consult   Reason for Consult: Suicidal ideation Referring Physician:  Dr. Karma Greaser Patient Identification: Nolin Grell MRN:  [id number] Principal Diagnosis: Major depressive disorder, recurrent, severe with psychotic features [facility]) Diagnosis:   Patient Active Problem List   Diagnosis Date Noted  . Tobacco use disorder [F17.200] 07/29/2016  . Major depressive disorder, recurrent, severe with psychotic features ([place]) [F33.3] 07/29/2016  . PTSD (post-traumatic stress disorder) [F43.10] 07/29/2016    Total Time spent with patient: 1 hour  Subjective:    Identifying data. Mr. Lenus Trauger is a 31 y.o. male with a history of bipolar disorder and alcoholism.  Chief complaint. "I have not been doing well for several months."  History of present illness. Information was obtained from the patient and the chart. The patient has a long history of bipolar disorder with multiple psychiatric hospital admissions. He has been tried on numerous medications but does not believe that medications have ever been helpful. He has not been taking any of several months now. His major stressor is the fact that he is not able to take care of his 56-year-old son. He became increasingly depressed with poor sleep, decreased appetite, weight loss, anhedonia, feeling of guilt and hopelessness worthlessness, poor energy and concentration of isolation, crying spells, and suicidal thinking with a plan to overdose. He started experiencing paranoia. His anxiety has been worse and he started experiencing frequent panic attacks as well as nightmares and flashbacks from past abuse. His drinking has escalated to daily use. His concentration was so lbad that he stopped going to work. He denies symptoms suggestive of bipolar mania. He denies other than alcohol substance use.  Past psychiatric history. The patient has 4 prior psychiatric hospitalizations. He's been tried on numerous medications including  lithium and Depakote. Lithium caused tremors and Depakote weight gain and they are both unacceptable. There were several suicide attempts including hanging. 3 years ago he was in substance use treatment program. She relapsed on substances almost immediately.  Family psychiatric history. Mother with bipolar disorder.  Social history. He lives with his uncle in [place]. He just lost his job at [employer].  Risk to Self: Suicidal Ideation: No-Not Currently/Within Last 6 Months ("a few days ago") Suicidal Intent: No-Not Currently/Within Last 6 Months Is patient at risk for suicide?: No Suicidal Plan?: No-Not Currently/Within Last 6 Months Access to Means: Yes Specify Access to Suicidal Means: Pt reports past thoughts of overdosing on pills What has been your use of drugs/alcohol within the last 12 months?: Alcohol; cocaine How many times?: 3 Other Self Harm Risks: None Reported Triggers for Past Attempts: Family contact, Other (Comment) (PTSD; past thoughts; being without his 28yo son) Intentional Self Injurious Behavior: None Risk to Others: Homicidal Ideation: No Thoughts of Harm to Others: No Current Homicidal Intent: No Current Homicidal Plan: No Access to Homicidal Means: No Identified Victim: N/A History of harm to others?: No Assessment of Violence: None Noted Violent Behavior Description: None Reported Does patient have access to weapons?: No Criminal Charges Pending?: No Does patient have a court date: No Prior Inpatient Therapy: Prior Inpatient Therapy: Yes Prior Therapy Dates: Range of Dates: 2012-2015 Prior Therapy Facilty/Provider(s): Pt reports inpatient treatment with [facility] Reason for Treatment: Depression, PTSD, suicidal attempts Prior Outpatient Therapy: Prior Outpatient Therapy: No (Pt states he never followed through with after care plan) Does patient have an ACCT team?: No Does patient have Intensive In-House Services?  : No Does patient have [facility]  services? : No  Does patient have P4CC services?: No  Past Medical History:  Past Medical History:  Diagnosis Date  . Depression    History reviewed. No pertinent surgical history. Family History: History reviewed. No pertinent family history.  Social History:  History  Alcohol Use  . Yes     History  Drug use: Unknown    Social History   Social History  . Marital status: Single    Spouse name: N/A  . Number of children: N/A  . Years of education: N/A   Social History Main Topics  . Smoking status: Current Every Day Smoker    Types: Cigarettes  . Smokeless tobacco: Never Used  . Alcohol use Yes  . Drug use: Unknown  . Sexual activity: Not Asked   Other Topics Concern  . None   Social History Narrative  . None   Additional Social History:    Allergies:  No Known Allergies  Labs:  Results for orders placed or performed during the hospital encounter of 07/28/16 (from the past 48 hour(s))  Comprehensive metabolic panel     Status: Abnormal   Collection Time: 07/28/16  1:49 PM  Result Value Ref Range   Sodium 137 135 - 145 mmol/L   Potassium 4.0 3.5 - 5.1 mmol/L   Chloride 102 101 - 111 mmol/L   CO2 29 22 - 32 mmol/L   Glucose, Bld 81 65 - 99 mg/dL   BUN 12 6 - 20 mg/dL   Creatinine, Ser 1.10 0.61 - 1.24 mg/dL   Calcium 9.5 8.9 - 10.3 mg/dL   Total Protein 7.3 6.5 - 8.1 g/dL   Albumin 4.1 3.5 - 5.0 g/dL   AST 20 15 - 41 U/L   ALT 16 (L) 17 - 63 U/L   Alkaline Phosphatase 42 38 - 126 U/L   Total Bilirubin 1.0 0.3 - 1.2 mg/dL   GFR calc non Af Amer >60 >60 mL/min   GFR calc Af Amer >60 >60 mL/min    Comment: (NOTE) The eGFR has been calculated using the CKD EPI equation. This calculation has not been validated in all clinical situations. eGFR's persistently <60 mL/min signify possible Chronic Kidney Disease.    Anion gap 6 5 - 15  Ethanol     Status: None   Collection Time: 07/28/16  1:49 PM  Result Value Ref Range   Alcohol, Ethyl (B) <5 <5 mg/dL     Comment:        LOWEST DETECTABLE LIMIT FOR SERUM ALCOHOL IS 5 mg/dL FOR MEDICAL PURPOSES ONLY   CBC with Differential     Status: None   Collection Time: 07/28/16  1:49 PM  Result Value Ref Range   WBC 7.1 3.8 - 10.6 K/uL   RBC 5.13 4.40 - 5.90 MIL/uL   Hemoglobin 15.2 13.0 - 18.0 g/dL   HCT 45.4 40.0 - 52.0 %   MCV 88.4 80.0 - 100.0 fL   MCH 29.6 26.0 - 34.0 pg   MCHC 33.5 32.0 - 36.0 g/dL   RDW 14.3 11.5 - 14.5 %   Platelets 190 150 - 440 K/uL   Neutrophils Relative % 70 %   Neutro Abs 5.0 1.4 - 6.5 K/uL   Lymphocytes Relative 18 %   Lymphs Abs 1.3 1.0 - 3.6 K/uL   Monocytes Relative 9 %   Monocytes Absolute 0.6 0.2 - 1.0 K/uL   Eosinophils Relative 2 %   Eosinophils Absolute 0.1 0 - 0.7 K/uL   Basophils Relative 1 %  Basophils Absolute 0.1 0 - 0.1 K/uL  Urinalysis, Complete w Microscopic     Status: Abnormal   Collection Time: 07/28/16  1:49 PM  Result Value Ref Range   Color, Urine STRAW (A) YELLOW   APPearance CLEAR (A) CLEAR   Specific Gravity, Urine 1.005 1.005 - 1.030   pH 8.0 5.0 - 8.0   Glucose, UA NEGATIVE NEGATIVE mg/dL   Hgb urine dipstick NEGATIVE NEGATIVE   Bilirubin Urine NEGATIVE NEGATIVE   Ketones, ur NEGATIVE NEGATIVE mg/dL   Protein, ur NEGATIVE NEGATIVE mg/dL   Nitrite NEGATIVE NEGATIVE   Leukocytes, UA NEGATIVE NEGATIVE   RBC / HPF NONE SEEN 0 - 5 RBC/hpf   WBC, UA NONE SEEN 0 - 5 WBC/hpf   Bacteria, UA NONE SEEN NONE SEEN   Squamous Epithelial / LPF NONE SEEN NONE SEEN   Mucous PRESENT   Urine Drug Screen, Qualitative     Status: None   Collection Time: 07/28/16  1:49 PM  Result Value Ref Range   Tricyclic, Ur Screen NONE DETECTED NONE DETECTED   Amphetamines, Ur Screen NONE DETECTED NONE DETECTED   MDMA (Ecstasy)Ur Screen NONE DETECTED NONE DETECTED   Cocaine Metabolite,Ur [place] NONE DETECTED NONE DETECTED   Opiate, Ur Screen NONE DETECTED NONE DETECTED   Phencyclidine (PCP) Ur S NONE DETECTED NONE DETECTED   Cannabinoid 50 Ng, Ur [place]  NONE DETECTED NONE DETECTED   Barbiturates, Ur Screen NONE DETECTED NONE DETECTED   Benzodiazepine, Ur Scrn NONE DETECTED NONE DETECTED   Methadone Scn, Ur NONE DETECTED NONE DETECTED    Comment: (NOTE) 213  Tricyclics, urine               Cutoff 1000 ng/mL 200  Amphetamines, urine             Cutoff 1000 ng/mL 300  MDMA (Ecstasy), urine           Cutoff 500 ng/mL 400  Cocaine Metabolite, urine       Cutoff 300 ng/mL 500  Opiate, urine                   Cutoff 300 ng/mL 600  Phencyclidine (PCP), urine      Cutoff 25 ng/mL 700  Cannabinoid, urine              Cutoff 50 ng/mL 800  Barbiturates, urine             Cutoff 200 ng/mL 900  Benzodiazepine, urine           Cutoff 200 ng/mL 1000 Methadone, urine                Cutoff 300 ng/mL 1100 1200 The urine drug screen provides only a preliminary, unconfirmed 1300 analytical test result and should not be used for non-medical 1400 purposes. Clinical consideration and professional judgment should 1500 be applied to any positive drug screen result due to possible 1600 interfering substances. A more specific alternate chemical method 1700 must be used in order to obtain a confirmed analytical result.  1800 Gas chromato graphy / mass spectrometry (GC/MS) is the preferred 1900 confirmatory method.   TSH     Status: None   Collection Time: 07/28/16  1:49 PM  Result Value Ref Range   TSH 0.743 0.350 - 4.500 uIU/mL    Comment: Performed by a 3rd Generation assay with a functional sensitivity of <=0.01 uIU/mL.  Acetaminophen level     Status: Abnormal   Collection Time: 07/28/16  1:49  PM  Result Value Ref Range   Acetaminophen (Tylenol), Serum <10 (L) 10 - 30 ug/mL    Comment:        THERAPEUTIC CONCENTRATIONS VARY SIGNIFICANTLY. A RANGE OF 10-30 ug/mL MAY BE AN EFFECTIVE CONCENTRATION FOR MANY PATIENTS. HOWEVER, SOME ARE BEST TREATED AT CONCENTRATIONS OUTSIDE THIS RANGE. ACETAMINOPHEN CONCENTRATIONS >150 ug/mL AT 4 HOURS  AFTER INGESTION AND >50 ug/mL AT 12 HOURS AFTER INGESTION ARE OFTEN ASSOCIATED WITH TOXIC REACTIONS.   Salicylate level     Status: None   Collection Time: 07/28/16  1:49 PM  Result Value Ref Range   Salicylate Lvl <7.3 2.8 - 30.0 mg/dL    Current Facility-Administered Medications  Medication Dose Route Frequency Provider Last Rate Last Dose  . acetaminophen (TYLENOL) tablet 650 mg  650 mg Oral Q6H PRN Hinda Kehr, MD   650 mg at 07/28/16 2321  . ARIPiprazole (ABILIFY) tablet 5 mg  5 mg Oral Daily Loney Hering, MD      . prazosin (MINIPRESS) capsule 1 mg  1 mg Oral QHS Loney Hering, MD   1 mg at 07/29/16 4193   Current Outpatient Prescriptions  Medication Sig Dispense Refill  . ARIPiprazole (ABILIFY) 5 MG tablet Take 1 tablet (5 mg total) by mouth daily. (Patient not taking: Reported on 07/28/2016) 15 tablet 0  . aspirin (ASPIRIN CHILDRENS) 81 MG chewable tablet Chew 1 tablet (81 mg total) by mouth daily. (Patient not taking: Reported on 07/28/2016) 30 tablet 0  . prazosin (MINIPRESS) 1 MG capsule Take 1 capsule (1 mg total) by mouth at bedtime. (Patient not taking: Reported on 07/28/2016) 15 capsule 0    Musculoskeletal: Strength & Muscle Tone: within normal limits Gait & Station: normal Patient leans: N/A  Psychiatric Specialty Exam: Physical Exam  Nursing note and vitals reviewed. Psychiatric: His speech is normal. His affect is blunt. He is withdrawn. Cognition and memory are normal. He expresses impulsivity. He exhibits a depressed mood. He expresses suicidal ideation. He expresses suicidal plans.    Review of Systems  Psychiatric/Behavioral: Positive for depression and suicidal ideas. The patient is nervous/anxious.   All other systems reviewed and are negative.   Blood pressure 120/74, pulse 84, temperature 98.6 F (37 C), temperature source Oral, resp. rate 16, height _0  (1.626 m), weight 69.4 kg (153 lb), SpO2 99 %.Body mass index is 26.26 kg/m.  General  Appearance: Casual  Eye Contact:  Good  Speech:  Clear and Coherent  Volume:  Normal  Mood:  Depressed and Hopeless  Affect:  Congruent  Thought Process:  Goal Directed and Descriptions of Associations: Intact  Orientation:  Full (Time, Place, and Person)  Thought Content:  Paranoid Ideation  Suicidal Thoughts:  Yes.  with intent/plan  Homicidal Thoughts:  No  Memory:  Immediate;   Fair Recent;   Fair Remote;   Fair  Judgement:  Poor  Insight:  Lacking  Psychomotor Activity:  Psychomotor Retardation  Concentration:  Concentration: Fair and Attention Span: Fair  Recall:  [employer] of Knowledge:  Fair  Language:  Fair  Akathisia:  No  Handed:  Right  AIMS (if indicated):     Assets:  Communication Skills Desire for Improvement Housing Physical Health Resilience Social Support  ADL's:  Intact  Cognition:  WNL  Sleep:        Treatment Plan Summary: Daily contact with patient to assess and evaluate symptoms and progress in treatment and Medication management   Mr. Morozov is a 31 year old male  with history of bipolar disorder and alcoholism admitted for suicidal ideation with a plan to overdose on medications in the context of worsening of his symptoms, treatment noncompliance, and drinking.  1. The patient will be admitted to behavioral medicine for further treatment and stabilization.  2. Mood. We will continue Abilify and Trileptal for mood stabilization.  3. Anxiety. We will increase Minipress 2 mg twice daily for nightmares and flashbacks.  4. Alcohol abuse. We will monitor for symptoms of alcohol withdrawal.  5. Substance abuse treatment. The patient agrees to substance abuse IOP following discharge.  6. Insomnia. Trazodone is available.  7. Metabolic syndrome monitoring. Lipid panel, TSH, hemoglobin A1c are pending.  8. EKG. Pending.  9. Disposition. He will be discharged back to home with family. He will follow up with [facility].    Disposition: Recommend  psychiatric Inpatient admission when medically cleared. Supportive therapy provided about ongoing stressors. Discussed crisis plan, support from social network, calling 911, coming to the Emergency Department, and calling Suicide Hotline.  Orson Slick, MD 07/29/2016 4:57 PM

## 2016-07-29 NOTE — ED Notes (Signed)
Pt informed me that his car is in the er parking lot a [id number] white lexus sedan es [id number]

## 2016-07-29 NOTE — ED Notes (Signed)
[facility]

## 2016-07-30 DIAGNOSIS — F314 Bipolar disorder, current episode depressed, severe, without psychotic features: Principal | ICD-10-CM

## 2016-07-30 LAB — LIPID PANEL
Cholesterol: 187 mg/dL (ref 0–200)
HDL: 85 mg/dL (ref >40)
LDL CALC: 87 mg/dL (ref 0–99)
Total CHOL/HDL Ratio: 2.2 RATIO
Triglycerides: 73 mg/dL (ref <150)
VLDL: 15 mg/dL (ref 0–40)

## 2016-07-30 LAB — TSH: TSH: 2.894 u[IU]/mL (ref 0.350–4.500)

## 2016-07-30 NOTE — Plan of Care (Signed)
Problem: Education: Goal: Verbalization of understanding the information provided will improve Outcome: Progressing Patient verbalized feelings to staff.    

## 2016-07-30 NOTE — [facility] Group Notes (Signed)
[facility] Group Notes:  (Nursing/MHT/Case Management/Adjunct)  Date:  07/30/2016  Time:  2:17 PM  Type of Therapy:  Psychoeducational Skills  Participation Level:  Did Not Attend  Roger Hall 07/30/2016, 2:17 PM

## 2016-07-30 NOTE — H&P (Signed)
Psychiatric Admission Assessment Adult  Patient Identification: Roger Hall MRN:  [id number] Date of Evaluation:  07/30/2016 Chief Complaint:  ptsd Principal Diagnosis: Bipolar I disorder, most recent episode depressed, severe without psychotic features [facility]) Diagnosis:   Patient Active Problem List   Diagnosis Date Noted  . Tobacco use disorder [F17.200] 07/29/2016  . PTSD (post-traumatic stress disorder) [F43.10] 07/29/2016  . Alcohol use disorder, moderate, dependence (HCC) [F10.20] 07/29/2016  . Bipolar I disorder, most recent episode depressed, severe without psychotic features (HCC) [F31.4] 07/29/2016   History of Present Illness:   Identifying data. [name] is a 31 y.o. male with a history of bipolar disorder and alcoholism.  Chief complaint. "I have not been doing well for several months."  History of present illness. Information was obtained from the patient and the chart. The patient has a long history of bipolar disorder with multiple psychiatric hospital admissions. He has been tried on numerous medications but does not believe that medications have ever been helpful. He has not been taking any of several months now. His major stressor includes the fact that his history of mental illness was disclosed at work and he did get into argument with a coworker who was mocking him resulting in job loss. He also is sad as he is unable to see his 22-year-old son. He became increasingly depressed with poor sleep, decreased appetite, weight loss, anhedonia, feeling of guilt and hopelessness worthlessness, poor energy and concentration of isolation, crying spells, and suicidal thinking with a plan to overdose. He started experiencing paranoia. His anxiety has been worse and he started experiencing frequent panic attacks as well as nightmares and flashbacks from past abuse. His drinking has escalated to daily use. His concentration was so lbad that he stopped going to work. He  denies symptoms suggestive of bipolar mania. He denies other than alcohol substance use.  Past psychiatric history. The patient has 4 prior psychiatric hospitalizations. He's been tried on numerous medications including lithium and Depakote. Lithium caused tremors and Depakote weight gain and they are both unacceptable. There were several suicide attempts including hanging. 3 years ago he was in substance use treatment program. She relapsed on substances almost immediately.  Family psychiatric history. Mother with bipolar disorder.  Social history. He lives with his uncle in [place]. He just lost his job at [employer].  Total Time spent with patient: 1 hour  Is the patient at risk to self? Yes.    Has the patient been a risk to self in the past 6 months? No.  Has the patient been a risk to self within the distant past? Yes.    Is the patient a risk to others? No.  Has the patient been a risk to others in the past 6 months? No.  Has the patient been a risk to others within the distant past? No.   Prior Inpatient Therapy:   Prior Outpatient Therapy:    Alcohol Screening: 1. How often do you have a drink containing alcohol?: 2 to 3 times a week 2. How many drinks containing alcohol do you have on a typical day when you are drinking?: 5 or 6 3. How often do you have six or more drinks on one occasion?: Weekly Preliminary Score: 5 4. How often during the last year have you found that you were not able to stop drinking once you had started?: Weekly 5. How often during the last year have you failed to do what was normally expected from you becasue of  drinking?: Weekly 6. How often during the last year have you needed a first drink in the morning to get yourself going after a heavy drinking session?: Weekly 7. How often during the last year have you had a feeling of guilt of remorse after drinking?: Daily or almost daily 8. How often during the last year have you been unable to remember what  happened the night before because you had been drinking?: Weekly 9. Have you or someone else been injured as a result of your drinking?: No 10. Has a relative or friend or a doctor or another health worker been concerned about your drinking or suggested you cut down?: Yes, but not in the last year Alcohol Use Disorder Identification Test Final Score (AUDIT): 26 Brief Intervention: Yes Substance Abuse History in the last 12 months:  Yes.   Consequences of Substance Abuse: Negative Previous Psychotropic Medications: Yes  Psychological Evaluations: No  Past Medical History:  Past Medical History:  Diagnosis Date  . Depression    History reviewed. No pertinent surgical history. Family History:  Family History  Problem Relation Age of Onset  . Depression Mother    Tobacco Screening: Have you used any form of tobacco in the last 30 days? (Cigarettes, Smokeless Tobacco, Cigars, and/or Pipes): Yes Tobacco use, Select all that apply: 5 or more cigarettes per day Are you interested in Tobacco Cessation Medications?: Yes, will notify MD for an order Counseled patient on smoking cessation including recognizing danger situations, developing coping skills and basic information about quitting provided: Yes Social History:  History  Alcohol Use  . 3.6 oz/week  . 6 Cans of beer per week     History  Drug use: Unknown    Additional Social History:                           Allergies:  No Known Allergies Lab Results:  Results for orders placed or performed during the hospital encounter of 07/29/16 (from the past 48 hour(s))  Lipid panel     Status: None   Collection Time: 07/30/16  7:11 AM  Result Value Ref Range   Cholesterol 187 0 - 200 mg/dL   Triglycerides 73 <161 mg/dL   HDL 85 >09 mg/dL   Total CHOL/HDL Ratio 2.2 RATIO   VLDL 15 0 - 40 mg/dL   LDL Cholesterol 87 0 - 99 mg/dL    Comment:        Total Cholesterol/HDL:CHD Risk Coronary Heart Disease Risk Table                      Men   Women  1/2 Average Risk   3.4   3.3  Average Risk       5.0   4.4  2 X Average Risk   9.6   7.1  3 X Average Risk  23.4   11.0        Use the calculated Patient Ratio above and the CHD Risk Table to determine the patient's CHD Risk.        ATP III CLASSIFICATION (LDL):  <100     mg/dL   Optimal  604-540  mg/dL   Near or Above                    Optimal  130-159  mg/dL   Borderline  981-191  mg/dL   High  >478     mg/dL  Very High   TSH     Status: None   Collection Time: 07/30/16  7:11 AM  Result Value Ref Range   TSH 2.894 0.350 - 4.500 uIU/mL    Comment: Performed by a 3rd Generation assay with a functional sensitivity of <=0.01 uIU/mL.    Blood Alcohol level:  Lab Results  Component Value Date   ETH <5 07/28/2016    Metabolic Disorder Labs:  No results found for: HGBA1C, MPG No results found for: PROLACTIN Lab Results  Component Value Date   CHOL 187 07/30/2016   TRIG 73 07/30/2016   HDL 85 07/30/2016   CHOLHDL 2.2 07/30/2016   VLDL 15 07/30/2016   LDLCALC 87 07/30/2016    Current Medications: Current Facility-Administered Medications  Medication Dose Route Frequency Provider Last Rate Last Dose  . acetaminophen (TYLENOL) tablet 650 mg  650 mg Oral Q6H PRN Skylene Deremer B Posey Jasmin, MD      . ARIPiprazole (ABILIFY) tablet 5 mg  5 mg Oral Daily Bailey Kolbe B Kriss Ishler, MD   5 mg at 07/30/16 0810  . hydrOXYzine (ATARAX/VISTARIL) tablet 50 mg  50 mg Oral TID PRN Shari Prows, MD   50 mg at 07/30/16 0035  . magnesium hydroxide (MILK OF MAGNESIA) suspension 30 mL  30 mL Oral Daily PRN Hope Brandenburger B Laiken Nohr, MD      . nicotine (NICODERM CQ - dosed in mg/24 hours) patch 21 mg  21 mg Transdermal Q0600 Shari Prows, MD   21 mg at 07/30/16 0659  . Oxcarbazepine (TRILEPTAL) tablet 300 mg  300 mg Oral BID Laasya Peyton B Raju Coppolino, MD   300 mg at 07/30/16 0810  . prazosin (MINIPRESS) capsule 1 mg  1 mg Oral BID Jerman Tinnon B Lavinia Mcneely, MD      . traZODone  (DESYREL) tablet 100 mg  100 mg Oral QHS Coren Crownover B Arlean Thies, MD   100 mg at 07/29/16 2300   PTA Medications: Prescriptions Prior to Admission  Medication Sig Dispense Refill Last Dose  . ARIPiprazole (ABILIFY) 5 MG tablet Take 1 tablet (5 mg total) by mouth daily. (Patient not taking: Reported on 07/28/2016) 15 tablet 0 Not Taking at Unknown time  . aspirin (ASPIRIN CHILDRENS) 81 MG chewable tablet Chew 1 tablet (81 mg total) by mouth daily. (Patient not taking: Reported on 07/28/2016) 30 tablet 0 Not Taking at Unknown time  . prazosin (MINIPRESS) 1 MG capsule Take 1 capsule (1 mg total) by mouth at bedtime. (Patient not taking: Reported on 07/28/2016) 15 capsule 0 Not Taking at Unknown time    Musculoskeletal: Strength & Muscle Tone: within normal limits Gait & Station: normal Patient leans: N/A  Psychiatric Specialty Exam: Physical Exam  Nursing note and vitals reviewed. Constitutional: He is oriented to person, place, and time. He appears well-developed and well-nourished.  HENT:  Head: Normocephalic and atraumatic.  Eyes: Conjunctivae and EOM are normal. Pupils are equal, round, and reactive to light.  Neck: Normal range of motion.  Cardiovascular: Normal rate, regular rhythm and normal heart sounds.   Respiratory: Effort normal and breath sounds normal.  GI: Soft. Bowel sounds are normal.  Musculoskeletal: Normal range of motion.  Neurological: He is alert and oriented to person, place, and time.  Skin: Skin is warm and dry.  Psychiatric: His speech is normal. His mood appears anxious. His affect is blunt. He is withdrawn. Cognition and memory are normal. He expresses impulsivity. He exhibits a depressed mood. He expresses suicidal ideation. He expresses suicidal plans.    Review  of Systems  Neurological: Positive for tremors.  Psychiatric/Behavioral: Positive for depression, substance abuse and suicidal ideas. The patient is nervous/anxious and has insomnia.   All other systems  reviewed and are negative.   Blood pressure (!) 105/53, pulse 71, temperature 98.4 F (36.9 C), temperature source Oral, resp. rate 20, height 5\' 4"  (1.626 m), weight 69.9 kg (154 lb), SpO2 100 %.Body mass index is 26.43 kg/m.  See SRA.                                                  Sleep:  Number of Hours: 5    Treatment Plan Summary: Daily contact with patient to assess and evaluate symptoms and progress in treatment and Medication management   Mr. Blackham is a 31 year old male with history of bipolar disorder and alcoholism admitted for suicidal ideation with a plan to overdose on medications in the context of worsening of his symptoms, treatment noncompliance, and drinking.  1. Suicidal ideation. The patient is able to contract for safety in the hospital.   2. Mood. We will continue Abilify and Trileptal for mood stabilization.  3. Anxiety. We continue Minipress 1 mg twice daily for nightmares and flashbacks. Blood pressure is low.   4. Alcohol abuse. He reports daily drinking. We will monitor for symptoms of alcohol withdrawal.  5. Substance abuse treatment. The patient agrees to substance abuse IOP participation following discharge.  6. Insomnia. Trazodone is available.  7. Metabolic syndrome monitoring. Lipid panel, TSH, hemoglobin A1c are pending.  8. EKG. Pending.  9. Disposition. He will be discharged back to home with family. He will follow up with [facility].   Observation Level/Precautions:  15 minute checks  Laboratory:  CBC Chemistry Profile UDS UA  Psychotherapy:    Medications:    Consultations:    Discharge Concerns:    Estimated LOS:  Other:     Physician Treatment Plan for Primary Diagnosis: Bipolar I disorder, most recent episode depressed, severe without psychotic features (HCC) Long Term Goal(s): Improvement in symptoms so as ready for discharge  Short Term Goals: Ability to identify changes in lifestyle to reduce  recurrence of condition will improve, Ability to verbalize feelings will improve, Ability to disclose and discuss suicidal ideas, Ability to demonstrate self-control will improve, Ability to identify and develop effective coping behaviors will improve, Ability to maintain clinical measurements within normal limits will improve, Compliance with prescribed medications will improve and Ability to identify triggers associated with substance abuse/mental health issues will improve  Physician Treatment Plan for Secondary Diagnosis: Principal Problem:   Bipolar I disorder, most recent episode depressed, severe without psychotic features (HCC) Active Problems:   Tobacco use disorder   PTSD (post-traumatic stress disorder)   Alcohol use disorder, moderate, dependence (HCC)  Long Term Goal(s): Improvement in symptoms so as ready for discharge  Short Term Goals: Ability to identify changes in lifestyle to reduce recurrence of condition will improve, Ability to demonstrate self-control will improve and Ability to identify triggers associated with substance abuse/mental health issues will improve  I certify that inpatient services furnished can reasonably be expected to improve the patient's condition.    Kristine LineaJolanta Arn Mcomber, MD 3/27/20189:40 AM

## 2016-07-30 NOTE — [facility] Group Notes (Signed)
[facility] LCSW Group Therapy Note  Date/Time: 07/30/16, 0930  Type of Therapy/Topic:  Group Therapy:  Feelings about Diagnosis  Participation Level:  Active   Mood:pleasant   Description of Group:    This group will allow patients to explore their thoughts and feelings about diagnoses they have received. Patients will be guided to explore their level of understanding and acceptance of these diagnoses. Facilitator will encourage patients to process their thoughts and feelings about the reactions of others to their diagnosis, and will guide patients in identifying ways to discuss their diagnosis with significant others in their lives. This group will be process-oriented, with patients participating in exploration of their own experiences as well as giving and receiving support and challenge from other group members.   Therapeutic Goals: 1. Patient will demonstrate understanding of diagnosis as evidence by identifying two or more symptoms of the disorder:  2. Patient will be able to express two feelings regarding the diagnosis 3. Patient will demonstrate ability to communicate their needs through discussion and/or role plays  Summary of Patient Progress: PT came in about halfway through group but immediately engaged in talking about how his family views his diagnosis.  Pt identified that he knows he has PTSD and thinks that he may have borderline personality and bipolar disorder. PT shared about feeling like the "black sheep" of his family and struggling to get support from them.          Therapeutic Modalities:   Cognitive Behavioral Therapy Brief Therapy Feelings Identification   Daleen SquibbGreg Fabrice Dyal, LCSW

## 2016-07-30 NOTE — Plan of Care (Signed)
Problem: Coping: Goal: Ability to verbalize frustrations and anger appropriately will improve Outcome: Progressing Working o  Materials engineerCoping  Skills

## 2016-07-30 NOTE — Progress Notes (Signed)
D:Isolative to room , Limited  Interaction with peer and staff. Appropriate  Behavior  And ADL'S  cooperative  With staff.  Patient wearing  His own  Clothing.  Unable to take Minipress  This am due to  Low  Blood pressure. Patient stated slept good last night .Stated fair  energy level  Is normal. Stated concentration is good . Stated on Depression scale 8 , hopeless 8 and anxiety 4 .( low 0-10 high) Denies suicidal  homicidal ideations  .  No auditory hallucinations  No pain concerns . Appropriate ADL'S. Interacting with peers and staff. Voice of wanting to escape guilt , fear shame  And focuse on himself  A: Encourage patient participation with unit programming . Instruction  Given on  Medication , verbalize understanding. R: Voice no other concerns. Staff continue to monitor

## 2016-07-30 NOTE — [facility] Suicide Risk Assessment (Signed)
[facility]   Nursing information obtained from:    Demographic factors:    Current Mental Status:    Loss Factors:    Historical Factors:    Risk Reduction Factors:     Total Time spent with patient: 1 hour Principal Problem: Bipolar I disorder, most recent episode depressed, severe without psychotic features (HCC) Diagnosis:   Patient Active Problem List   Diagnosis Date Noted  . Tobacco use disorder [F17.200] 07/29/2016  . PTSD (post-traumatic stress disorder) [F43.10] 07/29/2016  . Alcohol use disorder, moderate, dependence (HCC) [F10.20] 07/29/2016  . Bipolar I disorder, most recent episode depressed, severe without psychotic features (HCC) [F31.4] 07/29/2016   Subjective Data: suicidal ideation.  Continued Clinical Symptoms:  Alcohol Use Disorder Identification Test Final Score (AUDIT): 26 The "Alcohol Use Disorders Identification Test", Guidelines for Use in Primary Care, Second Edition.  World Science writerHealth Organization [facility](WHO). Score between 0-7:  no or low risk or alcohol related problems. Score between 8-15:  moderate risk of alcohol related problems. Score between 16-19:  high risk of alcohol related problems. Score 20 or above:  warrants further diagnostic evaluation for alcohol dependence and treatment.   CLINICAL FACTORS:   Severe Anxiety and/or Agitation Bipolar Disorder:   Depressive phase Depression:   Comorbid alcohol abuse/dependence Impulsivity Insomnia Alcohol/Substance Abuse/Dependencies More than one psychiatric diagnosis Unstable or Poor Therapeutic Relationship Previous Psychiatric Diagnoses and Treatments   Musculoskeletal: Strength & Muscle Tone: within normal limits Gait & Station: normal Patient leans: N/A  Psychiatric Specialty Exam: Physical Exam  Nursing note and vitals reviewed. Psychiatric: His speech is normal. His mood appears anxious. His affect is blunt. He is withdrawn. Cognition and memory are normal. He  expresses impulsivity. He exhibits a depressed mood. He expresses suicidal ideation. He expresses suicidal plans.    Review of Systems  Psychiatric/Behavioral: Positive for depression, substance abuse and suicidal ideas. The patient is nervous/anxious and has insomnia.   All other systems reviewed and are negative.   Blood pressure (!) 105/53, pulse 71, temperature 98.4 F (36.9 C), temperature source Oral, resp. rate 20, height 5\' 4"  (1.626 m), weight 69.9 kg (154 lb), SpO2 100 %.Body mass index is 26.43 kg/m.  General Appearance: Casual  Eye Contact:  Minimal  Speech:  Clear and Coherent  Volume:  Decreased  Mood:  Anxious, Depressed, Hopeless and Worthless  Affect:  Blunt  Thought Process:  Goal Directed and Descriptions of Associations: Intact  Orientation:  Full (Time, Place, and Person)  Thought Content:  WDL  Suicidal Thoughts:  Yes.  with intent/plan  Homicidal Thoughts:  No  Memory:  Immediate;   Fair Recent;   Fair Remote;   Fair  Judgement:  Impaired  Insight:  Shallow  Psychomotor Activity:  Psychomotor Retardation  Concentration:  Concentration: Fair and Attention Span: Fair  Recall:  FiservFair  Fund of Knowledge:  Fair  Language:  Fair  Akathisia:  No  Handed:  Right  AIMS (if indicated):     Assets:  Communication Skills Desire for Improvement Housing Physical Health Resilience Social Support  ADL's:  Intact  Cognition:  WNL  Sleep:  Number of Hours: 5      COGNITIVE FEATURES THAT CONTRIBUTE TO RISK:  None    SUICIDE RISK:   Moderate:  Frequent suicidal ideation with limited intensity, and duration, some specificity in terms of plans, no associated intent, good self-control, limited dysphoria/symptomatology, some risk factors present, and identifiable protective factors, including available and accessible social support.  PLAN OF CARE: Hospital admission, medication management, substance abuse counseling, discharge planning.  Roger Hall is a  31 year old male with history of bipolar disorder and alcoholism admitted for suicidal ideation with a plan to overdose on medications in the context of worsening of his symptoms, treatment noncompliance, and drinking.  1. Suicidal ideation. The patient is able to contract for safety in the hospital.   2. Mood. We will continue Abilify and Trileptal for mood stabilization.  3. Anxiety. We continue Minipress 1 mg twice daily for nightmares and flashbacks. Blood pressure is low.   4. Alcohol abuse. He reports daily drinking. We will monitor for symptoms of alcohol withdrawal.  5. Substance abuse treatment. The patient agrees to substance abuse IOP participation following discharge.  6. Insomnia. Trazodone is available.  7. Metabolic syndrome monitoring. Lipid panel, TSH, hemoglobin A1c are pending.  8. EKG. Pending.  9. Disposition. He will be discharged back to home with family. He will follow up with [facility].  I certify that inpatient services furnished can reasonably be expected to improve the patient's condition.   Kristine Linea, MD 07/30/2016, 9:35 AM

## 2016-07-30 NOTE — Progress Notes (Signed)
Admission Note:  [age] yr male who presents IVC in no acute distress for the treatment of SI and Depression. Pt is alert and oriented x 4, affect is  flat and depressed. Pt was calm and cooperative with admission process. Pt  denies SI/HI/AVH and contracts for safety upon admission. Patient experienced worsening depression, feeling worthless, sadness and guilt/shame. Pt has Past medical Hx of Depression. Patient in addition explained that he just recently lost job because his mental heath status was revealed to his co workers, who mocked him and made gestures at each other. Patient explained that this made him get upset and confrontation with another co worker and he was subsequently fired.  Skin was assessed and found to be clear of any abnormal marks, warm dry and intact. PT searched and no contraband found, POC and unit policies explained and understanding verbalized. Consents obtained. Food and fluids offered, and both accepted. Pt had no additional questions or concerns, 15 minutes checks maintained will continue to monitor

## 2016-07-30 NOTE — Progress Notes (Signed)
Patient ID: Roger Hall, male   DOB: 07/18/1985, 31 y.o.   MRN: [id number] A&Ox3, denied pain, mood and affect appropriate, no behavioral problems, interacting well with peers; denied SI/SIB/HI, denied AV/H.
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
## 2016-07-30 NOTE — Tx Team (Signed)
Initial Treatment Plan 07/30/2016 6:10 AM Roger Hall [id number]    PATIENT STRESSORS: Marital or family conflict Medication change or noncompliance Occupational concerns Traumatic event   PATIENT STRENGTHS: Ability for insight Capable of independent living Communication skills Motivation for treatment/growth   PATIENT IDENTIFIED PROBLEMS: DEPRESSION     "Feeling worthless and helpless"     suicidal ideation             DISCHARGE CRITERIA:  Improved stabilization in mood, thinking, and/or behavior Medical problems require only outpatient monitoring Motivation to continue treatment in a less acute level of care Reduction of life-threatening or endangering symptoms to within safe limits  PRELIMINARY DISCHARGE PLAN: Outpatient therapy  PATIENT/FAMILY INVOLVEMENT: This treatment plan has been presented to and reviewed with the patient, Roger Hall, and/or family member The patient and family have been given the opportunity to ask questions and make suggestions.  Trula Orelubukola Abisola Laketa Sandoz, RN 07/30/2016, 6:10 AM

## 2016-07-30 NOTE — Progress Notes (Deleted)
[facility] MD Progress Note  07/30/2016 6:21 PM Roger Hall  MRN:  [id number]  Subjective:    07/31/2016. Roger Hall met with treatment team today. He reports improved mood. His affect is brighter. He is just very anxious about discharge disposition. He is allowed to return to his mother house but he insists on going to substance abuse treatment center. Social worker to arrange for interview. He accepts medications and tolerates them well. Sleep and appetite are okay. Good group participation.  Per nursing: A&Ox3, denied pain, mood and affect appropriate, no behavioral problems, interacting well with peers; denied SI/SIB/HI, denied AV/H.  Principal Problem: Bipolar I disorder, most recent episode depressed, severe without psychotic features ([place]) Diagnosis:   Patient Active Problem List   Diagnosis Date Noted  . Tobacco use disorder [F17.200] 07/29/2016  . PTSD (post-traumatic stress disorder) [F43.10] 07/29/2016  . Alcohol use disorder, moderate, dependence ([place]) [F10.20] 07/29/2016  . Bipolar I disorder, most recent episode depressed, severe without psychotic features ([place]) [F31.4] 07/29/2016   Total Time spent with patient: 20 minutes  Past Psychiatric History: bipolar disorder.  Past Medical History:  Past Medical History:  Diagnosis Date  . Depression    History reviewed. No pertinent surgical history. Family History:  Family History  Problem Relation Age of Onset  . Depression Mother    Family Psychiatric  History: see H&P. Social History:  History  Alcohol Use  . 3.6 oz/week  . 6 Cans of beer per week     History  Drug use: Unknown    Social History   Social History  . Marital status: Single    Spouse name: N/A  . Number of children: N/A  . Years of education: N/A   Social History Main Topics  . Smoking status: Current Every Day Smoker    Packs/day: 0.50    Years: 10.00    Types: Cigarettes  . Smokeless tobacco: Never Used  . Alcohol use 3.6 oz/week    6  Cans of beer per week  . Drug use: Unknown  . Sexual activity: Not Currently   Other Topics Concern  . None   Social History Narrative  . None   Additional Social History:                         Sleep: Poor  Appetite:  Fair  Current Medications: Current Facility-Administered Medications  Medication Dose Route Frequency Provider Last Rate Last Dose  . acetaminophen (TYLENOL) tablet 650 mg  650 mg Oral Q6H PRN Matas Burrows B Malavika Lira, MD      . ARIPiprazole (ABILIFY) tablet 5 mg  5 mg Oral Daily Shuntell Foody B Loria Lacina, MD   5 mg at 07/30/16 0810  . hydrOXYzine (ATARAX/VISTARIL) tablet 50 mg  50 mg Oral TID PRN Clovis Fredrickson, MD   50 mg at 07/30/16 0035  . magnesium hydroxide (MILK OF MAGNESIA) suspension 30 mL  30 mL Oral Daily PRN Chaz Mcglasson B Toshika Parrow, MD      . nicotine (NICODERM CQ - dosed in mg/24 hours) patch 21 mg  21 mg Transdermal Q0600 Clovis Fredrickson, MD   21 mg at 07/30/16 0659  . Oxcarbazepine (TRILEPTAL) tablet 300 mg  300 mg Oral BID Clovis Fredrickson, MD   300 mg at 07/30/16 1636  . prazosin (MINIPRESS) capsule 1 mg  1 mg Oral BID Clovis Fredrickson, MD   1 mg at 07/30/16 1636  . traZODone (DESYREL) tablet 100 mg  100 mg  Oral QHS Clovis Fredrickson, MD   100 mg at 07/29/16 2300    Lab Results:  Results for orders placed or performed during the hospital encounter of 07/29/16 (from the past 48 hour(s))  Lipid panel     Status: None   Collection Time: 07/30/16  7:11 AM  Result Value Ref Range   Cholesterol 187 0 - 200 mg/dL   Triglycerides 73 <150 mg/dL   HDL 85 >40 mg/dL   Total CHOL/HDL Ratio 2.2 RATIO   VLDL 15 0 - 40 mg/dL   LDL Cholesterol 87 0 - 99 mg/dL    Comment:        Total Cholesterol/HDL:CHD Risk Coronary Heart Disease Risk Table                     Men   Women  1/2 Average Risk   3.4   3.3  Average Risk       5.0   4.4  2 X Average Risk   9.6   7.1  3 X Average Risk  23.4   11.0        Use the calculated Patient  Ratio above and the CHD Risk Table to determine the patient's CHD Risk.        ATP III CLASSIFICATION (LDL):  <100     mg/dL   Optimal  100-129  mg/dL   Near or Above                    Optimal  130-159  mg/dL   Borderline  160-189  mg/dL   High  >190     mg/dL   Very High   TSH     Status: None   Collection Time: 07/30/16  7:11 AM  Result Value Ref Range   TSH 2.894 0.350 - 4.500 uIU/mL    Comment: Performed by a 3rd Generation assay with a functional sensitivity of <=0.01 uIU/mL.    Blood Alcohol level:  Lab Results  Component Value Date   ETH <5 56/43/3295    Metabolic Disorder Labs: No results found for: HGBA1C, MPG No results found for: PROLACTIN Lab Results  Component Value Date   CHOL 187 07/30/2016   TRIG 73 07/30/2016   HDL 85 07/30/2016   CHOLHDL 2.2 07/30/2016   VLDL 15 07/30/2016   LDLCALC 87 07/30/2016    Physical Findings: AIMS: Facial and Oral Movements Muscles of Facial Expression: None, normal Lips and Perioral Area: None, normal Jaw: None, normal Tongue: None, normal,Extremity Movements Upper (arms, wrists, hands, fingers): None, normal Lower (legs, knees, ankles, toes): None, normal, Trunk Movements Neck, shoulders, hips: None, normal, Overall Severity Severity of abnormal movements (highest score from questions above): None, normal Incapacitation due to abnormal movements: None, normal Patient's awareness of abnormal movements (rate only patient's report): No Awareness, Dental Status Current problems with teeth and/or dentures?: No Does patient usually wear dentures?: No  CIWA:    COWS:  COWS Total Score: 2  Musculoskeletal: Strength & Muscle Tone: within normal limits Gait & Station: normal Patient leans: N/A  Psychiatric Specialty Exam: Physical Exam  Nursing note and vitals reviewed. Psychiatric: His speech is normal. He is withdrawn. Cognition and memory are normal. He expresses impulsivity. He exhibits a depressed mood. He  expresses suicidal ideation. He expresses suicidal plans.    Review of Systems  Psychiatric/Behavioral: Positive for depression, substance abuse and suicidal ideas. The patient is nervous/anxious and has insomnia.   All other systems  reviewed and are negative.   Blood pressure 127/69, pulse 74, temperature 98.4 F (36.9 C), temperature source Oral, resp. rate 18, height 5' 4"  (1.626 m), weight 69.9 kg (154 lb), SpO2 100 %.Body mass index is 26.43 kg/m.  General Appearance: Casual  Eye Contact:  Good  Speech:  Clear and Coherent  Volume:  Normal  Mood:  Depressed  Affect:  Blunt  Thought Process:  Goal Directed and Descriptions of Associations: Intact  Orientation:  Full (Time, Place, and Person)  Thought Content:  WDL  Suicidal Thoughts:  Yes.  with intent/plan  Homicidal Thoughts:  No  Memory:  Immediate;   Fair Recent;   Fair Remote;   Fair  Judgement:  Impaired  Insight:  Shallow  Psychomotor Activity:  Psychomotor Retardation  Concentration:  Concentration: Fair and Attention Span: Fair  Recall:  [employer] of Knowledge:  Fair  Language:  Fair  Akathisia:  No  Handed:  Right  AIMS (if indicated):     Assets:  Communication Skills Desire for Improvement Housing Physical Health Resilience Social Support  ADL's:  Intact  Cognition:  WNL  Sleep:  Number of Hours: 5     Treatment Plan Summary: Daily contact with patient to assess and evaluate symptoms and progress in treatment and Medication management   Roger Hall is a 32 year old male with history of bipolar disorder and alcoholism admitted for suicidal ideation with a plan to overdose on medications in the context of worsening of his symptoms, treatment noncompliance, and drinking.  1. Suicidal ideation. The patient is able to contract for safety in the hospital.   2. Mood. We will continue Abilify and Trileptal for mood stabilization.  3. Anxiety. We continue Minipress 1 mg twice daily for nightmares and  flashbacks. Blood pressure is low.   4. Alcohol abuse. He reports daily drinking. There were no symptoms of alcohol withdrawal.  5. Substance abuse treatment. The patient is interested in residential treatment.   6. Insomnia. We increased Trazodone.   7. Metabolic syndrome monitoring. Lipid panel, TSH and hemoglobin A1c are normal.   8. EKG. Normal sinus rhythm. QTC 392.   9. STDs. All tests are negative.  10. Disposition. He will be discharged back to home with family. He will follow up with [facility].  Orson Slick, MD 07/30/2016, 6:21 PM

## 2016-07-31 LAB — RPR: RPR Ser Ql: NONREACTIVE

## 2016-07-31 LAB — HEMOGLOBIN A1C
HEMOGLOBIN A1C: 5.3 % (ref 4.8–5.6)
MEAN PLASMA GLUCOSE: 105 mg/dL

## 2016-07-31 LAB — HEPATITIS C ANTIBODY

## 2016-07-31 LAB — HIV ANTIBODY (ROUTINE TESTING W REFLEX): HIV Screen 4th Generation wRfx: NONREACTIVE

## 2016-07-31 MED ORDER — OXCARBAZEPINE 300 MG PO TABS: 300.0000 mg | ORAL_TABLET | Freq: Two times a day (BID) | ORAL | 1 refills | Status: DC

## 2016-07-31 MED ORDER — PRAZOSIN HCL 1 MG PO CAPS: 1.0000 mg | ORAL_CAPSULE | Freq: Two times a day (BID) | ORAL | 1 refills | Status: DC

## 2016-07-31 MED ORDER — ARIPIPRAZOLE 5 MG PO TABS: 5.0000 mg | ORAL_TABLET | Freq: Every day | ORAL | 1 refills | Status: DC

## 2016-07-31 MED ORDER — FLUTICASONE PROPIONATE 50 MCG/ACT NA SUSP
2.0000 | Freq: Every day | NASAL | Status: DC
  Administered 2016-07-31 – 2016-08-02 (×3): 2 via NASAL
  Filled 2016-07-31: qty 16

## 2016-07-31 MED ORDER — LORATADINE 10 MG PO TABS
10.0000 mg | ORAL_TABLET | Freq: Every day | ORAL | Status: DC
  Administered 2016-07-31 – 2016-08-02 (×3): 10 mg via ORAL
  Filled 2016-07-31 (×3): qty 1

## 2016-07-31 MED ORDER — TRAZODONE HCL 100 MG PO TABS: 100.0000 mg | ORAL_TABLET | Freq: Every day | ORAL | 1 refills | Status: DC

## 2016-07-31 NOTE — [facility] Group Notes (Signed)
[facility] Group Notes:  (Nursing/MHT/Case Management/Adjunct)  Date:  07/31/2016  Time:  5:39 PM  Type of Therapy:  Psychoeducational Skills  Participation Level:  Active  Participation Quality:  Appropriate  Affect:  Appropriate  Cognitive:  Appropriate  Insight:  Good  Engagement in Group:  Engaged  Modes of Intervention:  Socialization  Summary of Progress/Problems:  Roger Hall 07/31/2016, 5:39 PM

## 2016-07-31 NOTE — Progress Notes (Signed)
Patient ID: Roger Hall, male   DOB: 1985-07-26, 31 y.o.   MRN: [id number] A&Ox4, c/o 6/10 lower back pain, Tylenol 650 mg PO, PRN given, will monitor relief. "I just want to get to my child, the last time I saw him was during his last birthday in [birth year] ....Marland Kitchen."Well groomed appearance, interacting well with peers, may be approaching baseline, denied SI/HI, denied AV/H, "I am craving for a taste of beer .Marland Kitchen...Marland Kitchen."

## 2016-07-31 NOTE — Progress Notes (Signed)
[facility] MD Progress Note  07/31/2016 11:45 AM Roger Hall  MRN:  [id number]  Subjective:    07/31/2016. [name] met with treatment team today. He reports improved mood. His affect is brighter. He is just very anxious about discharge disposition. He is allowed to return to his mother house but he insists on going to substance abuse treatment center. Social worker to arrange for interview. He accepts medications and tolerates them well. Sleep and appetite are okay. Good group participation.  Per nursing: A&Ox3, denied pain, mood and affect appropriate, no behavioral problems, interacting well with peers; denied SI/SIB/HI, denied AV/H.  Principal Problem: Bipolar I disorder, most recent episode depressed, severe without psychotic features ([name]) Diagnosis:   Patient Active Problem List   Diagnosis Date Noted  . Tobacco use disorder [F17.200] 07/29/2016  . PTSD (post-traumatic stress disorder) [F43.10] 07/29/2016  . Alcohol use disorder, moderate, dependence ([name]) [F10.20] 07/29/2016  . Bipolar I disorder, most recent episode depressed, severe without psychotic features ([name]) [F31.4] 07/29/2016   Total Time spent with patient: 20 minutes  Past Psychiatric History: bipolar disorder.  Past Medical History:  Past Medical History:  Diagnosis Date  . Depression    History reviewed. No pertinent surgical history. Family History:  Family History  Problem Relation Age of Onset  . Depression Mother    Family Psychiatric  History: see H&P. Social History:  History  Alcohol Use  . 3.6 oz/week  . 6 Cans of beer per week     History  Drug use: Unknown    Social History   Social History  . Marital status: Single    Spouse name: N/A  . Number of children: N/A  . Years of education: N/A   Social History Main Topics  . Smoking status: Current Every Day Smoker    Packs/day: 0.50    Years: 10.00    Types: Cigarettes  . Smokeless tobacco: Never Used  . Alcohol use 3.6 oz/week    6  Cans of beer per week  . Drug use: Unknown  . Sexual activity: Not Currently   Other Topics Concern  . None   Social History Narrative  . None   Additional Social History:                         Sleep: Poor  Appetite:  Fair  Current Medications: Current Facility-Administered Medications  Medication Dose Route Frequency Provider Last Rate Last Dose  . acetaminophen (TYLENOL) tablet 650 mg  650 mg Oral Q6H PRN Maelie Chriswell B Amarianna Abplanalp, MD      . ARIPiprazole (ABILIFY) tablet 5 mg  5 mg Oral Daily Clovis Fredrickson, MD   5 mg at 07/31/16 0808  . hydrOXYzine (ATARAX/VISTARIL) tablet 50 mg  50 mg Oral TID PRN Clovis Fredrickson, MD   50 mg at 07/30/16 0035  . magnesium hydroxide (MILK OF MAGNESIA) suspension 30 mL  30 mL Oral Daily PRN Chudney Scheffler B Melvia Matousek, MD      . nicotine (NICODERM CQ - dosed in mg/24 hours) patch 21 mg  21 mg Transdermal Q0600 Clovis Fredrickson, MD   21 mg at 07/30/16 0659  . Oxcarbazepine (TRILEPTAL) tablet 300 mg  300 mg Oral BID Clovis Fredrickson, MD   300 mg at 07/31/16 0808  . prazosin (MINIPRESS) capsule 1 mg  1 mg Oral BID Clovis Fredrickson, MD   1 mg at 07/31/16 0808  . traZODone (DESYREL) tablet 100 mg  100 mg  Oral QHS Clovis Fredrickson, MD   100 mg at 07/30/16 2120    Lab Results:  Results for orders placed or performed during the hospital encounter of 07/29/16 (from the past 48 hour(s))  Hemoglobin A1c     Status: None   Collection Time: 07/30/16  7:11 AM  Result Value Ref Range   Hgb A1c MFr Bld 5.3 4.8 - 5.6 %    Comment: (NOTE)         Pre-diabetes: 5.7 - 6.4         Diabetes: >6.4         Glycemic control for adults with diabetes: <7.0    Mean Plasma Glucose 105 mg/dL    Comment: (NOTE) Performed At: [facility] [place], [place] [id number] Lindon Romp MD IR:[id number]   Lipid panel     Status: None   Collection Time: 07/30/16  7:11 AM  Result Value Ref Range   Cholesterol 187 0 - 200  mg/dL   Triglycerides 73 <150 mg/dL   HDL 85 >40 mg/dL   Total CHOL/HDL Ratio 2.2 RATIO   VLDL 15 0 - 40 mg/dL   LDL Cholesterol 87 0 - 99 mg/dL    Comment:        Total Cholesterol/HDL:CHD Risk Coronary Heart Disease Risk Table                     Men   Women  1/2 Average Risk   3.4   3.3  Average Risk       5.0   4.4  2 X Average Risk   9.6   7.1  3 X Average Risk  23.4   11.0        Use the calculated Patient Ratio above and the CHD Risk Table to determine the patient's CHD Risk.        ATP III CLASSIFICATION (LDL):  <100     mg/dL   Optimal  100-129  mg/dL   Near or Above                    Optimal  130-159  mg/dL   Borderline  160-189  mg/dL   High  >190     mg/dL   Very High   TSH     Status: None   Collection Time: 07/30/16  7:11 AM  Result Value Ref Range   TSH 2.894 0.350 - 4.500 uIU/mL    Comment: Performed by a 3rd Generation assay with a functional sensitivity of <=0.01 uIU/mL.  RPR     Status: None   Collection Time: 07/30/16  7:11 AM  Result Value Ref Range   RPR Ser Ql Non Reactive Non Reactive    Comment: (NOTE) Performed At: [facility] [address] [place], [place] [id number] Lindon Romp MD AT:[id number]   HIV antibody     Status: None   Collection Time: 07/30/16  7:11 AM  Result Value Ref Range   HIV Screen 4th Generation wRfx Non Reactive Non Reactive    Comment: (NOTE) Performed At: [facility] [place], [place] [id number] Lindon Romp MD [id number]   Hepatitis C antibody     Status: None   Collection Time: 07/30/16  7:11 AM  Result Value Ref Range   HCV Ab <0.1 0.0 - 0.9 s/co ratio    Comment: (NOTE)  Negative:     < 0.8                             Indeterminate: 0.8 - 0.9                                  Positive:     > 0.9 The CDC recommends that a positive HCV antibody result be followed up with a HCV Nucleic Acid Amplification test (761950). Performed  At: [facility] [place], [place] [id number] Lindon Romp MD YK:[id number]     Blood Alcohol level:  Lab Results  Component Value Date   [facility] <5 39/76/7341    Metabolic Disorder Labs: Lab Results  Component Value Date   HGBA1C 5.3 07/30/2016   MPG 105 07/30/2016   No results found for: PROLACTIN Lab Results  Component Value Date   CHOL 187 07/30/2016   TRIG 73 07/30/2016   HDL 85 07/30/2016   CHOLHDL 2.2 07/30/2016   VLDL 15 07/30/2016   LDLCALC 87 07/30/2016    Physical Findings: AIMS: Facial and Oral Movements Muscles of Facial Expression: None, normal Lips and Perioral Area: None, normal Jaw: None, normal Tongue: None, normal,Extremity Movements Upper (arms, wrists, hands, fingers): None, normal Lower (legs, knees, ankles, toes): None, normal, Trunk Movements Neck, shoulders, hips: None, normal, Overall Severity Severity of abnormal movements (highest score from questions above): None, normal Incapacitation due to abnormal movements: None, normal Patient's awareness of abnormal movements (rate only patient's report): No Awareness, Dental Status Current problems with teeth and/or dentures?: No Does patient usually wear dentures?: No  CIWA:    COWS:  COWS Total Score: 2  Musculoskeletal: Strength & Muscle Tone: within normal limits Gait & Station: normal Patient leans: N/A  Psychiatric Specialty Exam: Physical Exam  Nursing note and vitals reviewed. Psychiatric: His speech is normal. He is withdrawn. Cognition and memory are normal. He expresses impulsivity. He exhibits a depressed mood. He expresses suicidal ideation. He expresses suicidal plans.    Review of Systems  Psychiatric/Behavioral: Positive for depression, substance abuse and suicidal ideas. The patient is nervous/anxious and has insomnia.   All other systems reviewed and are negative.   Blood pressure 129/70, pulse 68, temperature 98.2 F (36.8 C), resp. rate 18,  height 5' 4"  (1.626 m), weight 69.9 kg (154 lb), SpO2 100 %.Body mass index is 26.43 kg/m.  General Appearance: Casual  Eye Contact:  Good  Speech:  Clear and Coherent  Volume:  Normal  Mood:  Depressed  Affect:  Blunt  Thought Process:  Goal Directed and Descriptions of Associations: Intact  Orientation:  Full (Time, Place, and Person)  Thought Content:  WDL  Suicidal Thoughts:  Yes.  with intent/plan  Homicidal Thoughts:  No  Memory:  Immediate;   Fair Recent;   Fair Remote;   Fair  Judgement:  Impaired  Insight:  Shallow  Psychomotor Activity:  Psychomotor Retardation  Concentration:  Concentration: Fair and Attention Span: Fair  Recall:  [employer] of Knowledge:  Fair  Language:  Fair  Akathisia:  No  Handed:  Right  AIMS (if indicated):     Assets:  Communication Skills Desire for Improvement Housing Physical Health Resilience Social Support  ADL's:  Intact  Cognition:  WNL  Sleep:  Number of Hours: 5.45     Treatment Plan Summary: Daily contact with  patient to assess and evaluate symptoms and progress in treatment and Medication management   Mr. Kasper is a 31 year old male with history of bipolar disorder and alcoholism admitted for suicidal ideation with a plan to overdose on medications in the context of worsening of his symptoms, treatment noncompliance, and drinking.  1. Suicidal ideation. The patient is able to contract for safety in the hospital.   2. Mood. We will continue Abilify and Trileptal for mood stabilization.  3. Anxiety. We continue Minipress 1 mg twice daily for nightmares and flashbacks. Blood pressure is low.   4. Alcohol abuse. He reports daily drinking. There were no symptoms of alcohol withdrawal.  5. Substance abuse treatment. The patient is interested in residential treatment.   6. Insomnia. We increased Trazodone.   7. Metabolic syndrome monitoring. Lipid panel, TSH and hemoglobin A1c are normal.   8. EKG. Normal  sinus rhythm. QTC 392.   9. STDs. All tests are negative.  10. Disposition. He will be discharged back to home with family. He will follow up with [facility].  Orson Slick, MD 07/31/2016, 11:45 AM

## 2016-07-31 NOTE — Plan of Care (Signed)
Problem: Coping: Goal: Ability to cope will improve Outcome: Progressing Working on  [employer]oping skills , handout given

## 2016-07-31 NOTE — Tx Team (Signed)
Interdisciplinary Treatment and Diagnostic Plan Update  07/31/2016 Time of Session: 1050 Roger Hall MRN: [id number]  Principal Diagnosis: Bipolar I disorder, most recent episode depressed, severe without psychotic features (HCC)  Secondary Diagnoses: Principal Problem:   Bipolar I disorder, most recent episode depressed, severe without psychotic features (HCC) Active Problems:   Tobacco use disorder   PTSD (post-traumatic stress disorder)   Alcohol use disorder, moderate, dependence (HCC)   Current Medications:  Current Facility-Administered Medications  Medication Dose Route Frequency Provider Last Rate Last Dose  . acetaminophen (TYLENOL) tablet 650 mg  650 mg Oral Q6H PRN Jolanta B Pucilowska, MD      . ARIPiprazole (ABILIFY) tablet 5 mg  5 mg Oral Daily Shari Prows, MD   5 mg at 07/31/16 0808  . hydrOXYzine (ATARAX/VISTARIL) tablet 50 mg  50 mg Oral TID PRN Shari Prows, MD   50 mg at 07/30/16 0035  . magnesium hydroxide (MILK OF MAGNESIA) suspension 30 mL  30 mL Oral Daily PRN Jolanta B Pucilowska, MD      . nicotine (NICODERM CQ - dosed in mg/24 hours) patch 21 mg  21 mg Transdermal Q0600 Shari Prows, MD   21 mg at 07/30/16 0659  . Oxcarbazepine (TRILEPTAL) tablet 300 mg  300 mg Oral BID Shari Prows, MD   300 mg at 07/31/16 0808  . prazosin (MINIPRESS) capsule 1 mg  1 mg Oral BID Shari Prows, MD   1 mg at 07/31/16 0808  . traZODone (DESYREL) tablet 100 mg  100 mg Oral QHS Shari Prows, MD   100 mg at 07/30/16 2120   PTA Medications: Prescriptions Prior to Admission  Medication Sig Dispense Refill Last Dose  . ARIPiprazole (ABILIFY) 5 MG tablet Take 1 tablet (5 mg total) by mouth daily. (Patient not taking: Reported on 07/28/2016) 15 tablet 0 Not Taking at Unknown time  . aspirin (ASPIRIN CHILDRENS) 81 MG chewable tablet Chew 1 tablet (81 mg total) by mouth daily. (Patient not taking: Reported on 07/28/2016) 30 tablet 0 Not  Taking at Unknown time  . prazosin (MINIPRESS) 1 MG capsule Take 1 capsule (1 mg total) by mouth at bedtime. (Patient not taking: Reported on 07/28/2016) 15 capsule 0 Not Taking at Unknown time    Patient Stressors: Marital or family conflict Medication change or noncompliance Occupational concerns Traumatic event  Patient Strengths: Ability for insight Capable of independent living Communication skills Motivation for treatment/growth  Treatment Modalities: Medication Management, Group therapy, Case management,  1 to 1 session with clinician, Psychoeducation, Recreational therapy.   Physician Treatment Plan for Primary Diagnosis: Bipolar I disorder, most recent episode depressed, severe without psychotic features (HCC) Long Term Goal(s): Improvement in symptoms so as ready for discharge Improvement in symptoms so as ready for discharge   Short Term Goals: Ability to identify changes in lifestyle to reduce recurrence of condition will improve Ability to verbalize feelings will improve Ability to disclose and discuss suicidal ideas Ability to demonstrate self-control will improve Ability to identify and develop effective coping behaviors will improve Ability to maintain clinical measurements within normal limits will improve Compliance with prescribed medications will improve Ability to identify triggers associated with substance abuse/mental health issues will improve Ability to identify changes in lifestyle to reduce recurrence of condition will improve Ability to demonstrate self-control will improve Ability to identify triggers associated with substance abuse/mental health issues will improve  Medication Management: Evaluate patient's response, side effects, and tolerance of medication regimen.  Therapeutic Interventions:  1 to 1 sessions, Unit Group sessions and Medication administration.  Evaluation of Outcomes: Progressing  Physician Treatment Plan for Secondary Diagnosis:  Principal Problem:   Bipolar I disorder, most recent episode depressed, severe without psychotic features (HCC) Active Problems:   Tobacco use disorder   PTSD (post-traumatic stress disorder)   Alcohol use disorder, moderate, dependence (HCC)  Long Term Goal(s): Improvement in symptoms so as ready for discharge Improvement in symptoms so as ready for discharge   Short Term Goals: Ability to identify changes in lifestyle to reduce recurrence of condition will improve Ability to verbalize feelings will improve Ability to disclose and discuss suicidal ideas Ability to demonstrate self-control will improve Ability to identify and develop effective coping behaviors will improve Ability to maintain clinical measurements within normal limits will improve Compliance with prescribed medications will improve Ability to identify triggers associated with substance abuse/mental health issues will improve Ability to identify changes in lifestyle to reduce recurrence of condition will improve Ability to demonstrate self-control will improve Ability to identify triggers associated with substance abuse/mental health issues will improve     Medication Management: Evaluate patient's response, side effects, and tolerance of medication regimen.  Therapeutic Interventions: 1 to 1 sessions, Unit Group sessions and Medication administration.  Evaluation of Outcomes: Progressing   RN Treatment Plan for Primary Diagnosis: Bipolar I disorder, most recent episode depressed, severe without psychotic features (HCC) Long Term Goal(s): Knowledge of disease and therapeutic regimen to maintain health will improve  Short Term Goals: Ability to verbalize feelings will improve, Ability to identify and develop effective coping behaviors will improve and Compliance with prescribed medications will improve  Medication Management: RN will administer medications as ordered by provider, will assess and evaluate patient's  response and provide education to patient for prescribed medication. RN will report any adverse and/or side effects to prescribing provider.  Therapeutic Interventions: 1 on 1 counseling sessions, Psychoeducation, Medication administration, Evaluate responses to treatment, Monitor vital signs and CBGs as ordered, Perform/monitor CIWA, COWS, AIMS and Fall Risk screenings as ordered, Perform wound care treatments as ordered.  Evaluation of Outcomes: Progressing   LCSW Treatment Plan for Primary Diagnosis: Bipolar I disorder, most recent episode depressed, severe without psychotic features (HCC) Long Term Goal(s): Safe transition to appropriate next level of care at discharge, Engage patient in therapeutic group addressing interpersonal concerns.  Short Term Goals: Engage patient in aftercare planning with referrals and resources, Increase social support and Increase skills for wellness and recovery  Therapeutic Interventions: Assess for all discharge needs, 1 to 1 time with Social worker, Explore available resources and support systems, Assess for adequacy in community support network, Educate family and significant other(s) on suicide prevention, Complete Psychosocial Assessment, Interpersonal group therapy.  Evaluation of Outcomes: Progressing   Progress in Treatment: Attending groups: Yes. Participating in groups: Yes. Taking medication as prescribed: Yes. Toleration medication: Yes. Family/Significant other contact made: Yes, individual(s) contacted:  mother Patient understands diagnosis: Yes. Discussing patient identified problems/goals with staff: Yes. Medical problems stabilized or resolved: Yes. Denies suicidal/homicidal ideation: Yes. Issues/concerns per patient self-inventory: No. Other: none  New problem(s) identified: No, Describe:  none  New Short Term/Long Term Goal(s): Pt goal is to "make a positive change" by pursuing residential substance abuse treatment.  Discharge  Plan or Barriers: Residential substance abuse treatment or outpatient follow up.  Reason for Continuation of Hospitalization: Depression Medication stabilization  Estimated Length of Stay: 3-4 days.  Attendees: Patient:  Roger Hall 07/31/2016   Physician: Dr. Jennet Maduro, MD  07/31/2016   Nursing: Hulan AmatoGwen Farrish, RN 07/31/2016   RN Care Manager: 07/31/2016   Social Worker: Daleen SquibbGreg Estill Llerena, LCSW 07/31/2016   Recreational Therapist:  07/31/2016   Other:  07/31/2016   Other:  07/31/2016   Other: 07/31/2016     Scribe for Treatment Team: Lorri FrederickWierda, Sahvanna Mcmanigal Jon, LCSW 07/31/2016 12:46 PM

## 2016-07-31 NOTE — Progress Notes (Signed)
D:Patient  Voice  his goal to find  Stability and recourses to keep on track . Attended  Treatment  Team  Connect with the same  Information  With getting back on track and  Reuniting with his son .  Patient stated slept poorlast night .Stated appetite is good and energy level  Is normal. Stated concentration is good . Stated on Depression scale  6, hopeless 10   and anxiety 4 .( low 0-10 high) Denies suicidal  homicidal ideations  .  No auditory hallucinations  No pain concerns . Appropriate ADL'S. Interacting with peers and staff. Noted to interact better with  Peers   A: Encourage patient participation with unit programming . Instruction  Given on  Medication , verbalize understanding. R: Voice no other concerns. Staff continue to monitor

## 2016-07-31 NOTE — [facility] Group Notes (Signed)
[facility] LCSW Group Therapy   07/31/2016  9:30 am   Type of Therapy: Group Therapy   Participation Level: Active   Participation Quality: Attentive, Sharing and Supportive   Affect: Appropriate   Cognitive: Alert and Oriented   Insight: Developing/Improving and Engaged   Engagement in Therapy: Developing/Improving and Engaged   Modes of Intervention: Clarification, Confrontation, Discussion, Education, Exploration, Limit-setting, Orientation, Problem-solving, Rapport Building, Dance movement psychotherapisteality Testing, Socialization and Support   Summary of Progress/Problems: The topic for group today was emotional regulation. This group focused on both positive and negative emotion identification and allowed  group members to process ways to identify feelings, regulate negative emotions, and find healthy ways to manage internal/external emotions. Group members were asked to reflect on a time when their reaction to an emotion led to a negative outcome and explored how alternative responses using emotion regulation would have benefited them. Group members were also asked to discuss a time when emotion regulation was utilized when a negative emotion was experienced. Pt defined emotion regulation as "having control over one's mind." He stated that his negative emotion was hopelessness. His reactions were acting on suicidal thoughts. Pt identified healthy coping mechanisms.     Hampton AbbotKadijah Wofford Stratton, MSW, LCSWA 07/31/2016, 1:17PM

## 2016-07-31 NOTE — Plan of Care (Signed)
Problem: Activity: Goal: Sleeping patterns will improve Outcome: Progressing Patient slept for Estimated Hours of 5.45; every 15 minutes safety round maintained, no injury or falls during this shift.    

## 2016-07-31 NOTE — [facility] Suicide Risk Assessment (Signed)
[facility] INPATIENT:  Family/Significant Other Suicide Prevention Education  Suicide Prevention Education:  Education Completed; Roger Hall, mother, [phone], has been identified by the patient as the family member/significant other with whom the patient will be residing, and identified as the person(s) who will aid the patient in the event of a mental health crisis (suicidal ideations/suicide attempt).  With written consent from the patient, the family member/significant other has been provided the following suicide prevention education, prior to the and/or following the discharge of the patient.  The suicide prevention education provided includes the following:  Suicide risk factors  Suicide prevention and interventions  National Suicide Hotline telephone number  [facility] assessment telephone number  [facility] Emergency Assistance 911  [facility] and/or Residential Mobile Crisis Unit telephone number  Request made of family/significant other to:  Remove weapons (e.g., guns, rifles, knives), all items previously/currently identified as safety concern.  No guns in the home.  Remove drugs/medications (over-the-counter, prescriptions, illicit drugs), all items previously/currently identified as a safety concern.  The family member/significant other verbalizes understanding of the suicide prevention education information provided.  The family member/significant other agrees to remove the items of safety concern listed above.  Roger Hall is planning on pt coming back home to stay with her upon discharge.  Roger Hall, Roger Bains Jon, LCSW 07/31/2016, 8:47 AM

## 2016-07-31 NOTE — [facility] Group Notes (Signed)
[facility] Group Notes:  (Nursing/MHT/Case Management/Adjunct)  Date:  07/31/2016  Time:  12:42 AM  Type of Therapy:  Evening Wrap-up Group  Participation Level:  Active  Participation Quality:  Appropriate and Attentive  Affect:  Appropriate  Cognitive:  Alert and Appropriate  Insight:  Appropriate, Good and Improving  Engagement in Group:  Developing/Improving and Engaged  Modes of Intervention:  Discussion  Summary of Progress/Problems:  Tomasita MorrowChelsea Nanta Emma Schupp 07/31/2016, 12:42 AM

## 2016-08-01 MED ORDER — QUETIAPINE FUMARATE 25 MG PO TABS
50.0000 mg | ORAL_TABLET | Freq: Every day | ORAL | Status: DC
  Administered 2016-08-01: 50 mg via ORAL
  Filled 2016-08-01: qty 2

## 2016-08-01 MED ORDER — QUETIAPINE FUMARATE 50 MG PO TABS: 50.0000 mg | ORAL_TABLET | Freq: Every day | ORAL | 1 refills | Status: DC

## 2016-08-01 NOTE — Progress Notes (Signed)
Patient ID: Roger Hall, male   DOB: 05-12-1985, 31 y.o.   MRN: [id number]  CSW contacted [facility] in [place] to confirm time that would be best for patient to call and complete intake for that program. Staff member Gerald Stabs informed CSW that 12noon would be a good time for patient to call.  CSW met with patient and provided patient with number to [facility] [phone])  and informed him to call at 12noon. Patient stated he understood and had no further questions at this time. CSW will follow-up with patient this afternoon to follow-up about his phone interview for intake at [facility].   Darcy Cordner G. [place], [place] 08/01/2016 11:00 AM

## 2016-08-01 NOTE — [facility] Group Notes (Signed)
Goals Group Date/Time: 08/01/2016 9:00 AM Type of Therapy and Topic: Group Therapy: Goals Group: SMART Goals   Participation Level: Moderate  Description of Group:    The purpose of a daily goals group is to assist and guide patients in setting recovery/wellness-related goals. The objective is to set goals as they relate to the crisis in which they were admitted. Patients will be using SMART goal modalities to set measurable goals. Characteristics of realistic goals will be discussed and patients will be assisted in setting and processing how one will reach their goal. Facilitator will also assist patients in applying interventions and coping skills learned in psycho-education groups to the SMART goal and process how one will achieve defined goal.   Therapeutic Goals:   -Patients will develop and document one goal related to or their crisis in which brought them into treatment.  -Patients will be guided by LCSW using SMART goal setting modality in how to set a measurable, attainable, realistic and time sensitive goal.  -Patients will process barriers in reaching goal.  -Patients will process interventions in how to overcome and successful in reaching goal.   Patient's Goal: Pt goal was to work towards identifying a substance abuse treatment center for his discharge plan today.   Therapeutic Modalities:  Motivational Interviewing  Research officer, political partyCognitive Behavioral Therapy  Crisis Intervention Model  SMART goals setting   Daleen SquibbGreg Zyliah Schier, [place]

## 2016-08-01 NOTE — Progress Notes (Signed)
[facility] LCSW Group Therapy Note  Date/Time: 08/01/16, 0930  Type of Therapy/Topic:  Group Therapy:  Balance in Life  Participation Level:  active  Description of Group:    This group will address the concept of balance and how it feels and looks when one is unbalanced. Patients will be encouraged to process areas in their lives that are out of balance, and identify reasons for remaining unbalanced. Facilitators will guide patients utilizing problem- solving interventions to address and correct the stressor making their life unbalanced. Understanding and applying boundaries will be explored and addressed for obtaining  and maintaining a balanced life. Patients will be encouraged to explore ways to assertively make their unbalanced needs known to significant others in their lives, using other group members and facilitator for support and feedback.  Therapeutic Goals: 1. Patient will identify two or more emotions or situations they have that consume much of in their lives. 2. Patient will identify signs/triggers that life has become out of balance:  3. Patient will identify two ways to set boundaries in order to achieve balance in their lives:  4. Patient will demonstrate ability to communicate their needs through discussion and/or role plays  Summary of Patient Progress: Pt identified work, family, and mental/emotional as areas of his life that are out of balance.  Pt talked about his desire to shut his family completely out of his life due to past problems and what the positive and negative result of that would be.  Good sharing.          Therapeutic Modalities:   Cognitive Behavioral Therapy Solution-Focused Therapy Assertiveness Training  Daleen SquibbGreg Summer Parthasarathy, [place]

## 2016-08-01 NOTE — Progress Notes (Signed)
Patient ID: Roger Hall, male   DOB: 09/17/1985, 31 y.o.   MRN: [id number]  CSW talked with patient about [facility]. Patient stated it went well and that they have scheduled a phone interview for tomorrow at 11:00am. CSW also discussed with patient what his discharge plan will be if he does not get accepted [facility]. Patient stated he would return to his uncle house in [place] and follow-up with outpatient services. Patient also discussed his insurance and stated that although he does have [employer] Currently, he will loss that insurance soon due to him recently losing his job. CSW will refer patient to [facility] in [place] for outpatient services to accommodate his lost of insurance. [facility] accepts both [employer] and uninsured patients.   Lue Sykora G. Garnette CzechSampson MSW, [facility] HospitalCSWA 08/01/2016 2:46 PM

## 2016-08-01 NOTE — Progress Notes (Signed)
AAOx4, clean, pleasant, and cooperative. Compliant with medications. Offers no complaints and denies SI.HI.AVH. Is for discharge tomorrow, 7 day supply available in medication room. Will monitor and continue to provide a safe, therapeutic milieu.

## 2016-08-01 NOTE — Progress Notes (Signed)
[facility] MD Progress Note  08/01/2016 2:41 PM Zaid Tomes  MRN:  [id number]  Subjective:    07/31/2016. Mr. Covelli met with treatment team today. He reports improved mood. His affect is brighter. He is just very anxious about discharge disposition. He is allowed to return to his mother house but he insists on going to substance abuse treatment center. Social worker to arrange for interview. He accepts medications and tolerates them well. Sleep and appetite are okay. Good group participation.  08/01/2016. Mr. Cumbie went through the first interview with [facility]. There is a second interview this afternoon. He reports much improvement. Accepts and tolerates medications well. Complains of poor sleep with Trazodone and is asking for additional Seroquel at night. Anticipated discharge tomorrow.   Per nursing: A&Ox4, c/o 6/10 lower back pain, Tylenol 650 mg PO, PRN given, will monitor relief. "I just want to get to my child, the last time I saw him was during his last birthday in September ....Marland Kitchen"Well groomed appearance, interacting well with peers, may be approaching baseline, denied SI/HI, denied AV/H, "I am craving for a taste of beer .Marland Kitchen..."   Principal Problem: Bipolar I disorder, most recent episode depressed, severe without psychotic features ([place]) Diagnosis:   Patient Active Problem List   Diagnosis Date Noted  . Tobacco use disorder [F17.200] 07/29/2016  . PTSD (post-traumatic stress disorder) [F43.10] 07/29/2016  . Alcohol use disorder, moderate, dependence ([place]) [F10.20] 07/29/2016  . Bipolar I disorder, most recent episode depressed, severe without psychotic features ([place]) [F31.4] 07/29/2016   Total Time spent with patient: 20 minutes  Past Psychiatric History: bipolar disorder.  Past Medical History:  Past Medical History:  Diagnosis Date  . Depression    History reviewed. No pertinent surgical history. Family History:  Family History  Problem Relation Age of Onset  .  Depression Mother    Family Psychiatric  History: see H&P. Social History:  History  Alcohol Use  . 3.6 oz/week  . 6 Cans of beer per week     History  Drug use: Unknown    Social History   Social History  . Marital status: Single    Spouse name: N/A  . Number of children: N/A  . Years of education: N/A   Social History Main Topics  . Smoking status: Current Every Day Smoker    Packs/day: 0.50    Years: 10.00    Types: Cigarettes  . Smokeless tobacco: Never Used  . Alcohol use 3.6 oz/week    6 Cans of beer per week  . Drug use: Unknown  . Sexual activity: Not Currently   Other Topics Concern  . None   Social History Narrative  . None   Additional Social History:                         Sleep: Poor  Appetite:  Fair  Current Medications: Current Facility-Administered Medications  Medication Dose Route Frequency Provider Last Rate Last Dose  . acetaminophen (TYLENOL) tablet 650 mg  650 mg Oral Q6H PRN Clovis Fredrickson, MD   650 mg at 07/31/16 2134  . ARIPiprazole (ABILIFY) tablet 5 mg  5 mg Oral Daily Clovis Fredrickson, MD   5 mg at 08/01/16 0923  . fluticasone (FLONASE) 50 MCG/ACT nasal spray 2 spray  2 spray Each Nare Daily Clovis Fredrickson, MD   2 spray at 08/01/16 0922  . hydrOXYzine (ATARAX/VISTARIL) tablet 50 mg  50 mg Oral TID PRN Jasey Cortez  Vevelyn Francois, MD   50 mg at 07/30/16 0035  . loratadine (CLARITIN) tablet 10 mg  10 mg Oral Daily Clovis Fredrickson, MD   10 mg at 08/01/16 0924  . magnesium hydroxide (MILK OF MAGNESIA) suspension 30 mL  30 mL Oral Daily PRN Henley Boettner B Susanna Benge, MD      . nicotine (NICODERM CQ - dosed in mg/24 hours) patch 21 mg  21 mg Transdermal Q0600 Clovis Fredrickson, MD   21 mg at 08/01/16 0924  . Oxcarbazepine (TRILEPTAL) tablet 300 mg  300 mg Oral BID Clovis Fredrickson, MD   300 mg at 08/01/16 0923  . prazosin (MINIPRESS) capsule 1 mg  1 mg Oral BID Clovis Fredrickson, MD   1 mg at 08/01/16 5809     Lab Results:  No results found for this or any previous visit (from the past 48 hour(s)).  Blood Alcohol level:  Lab Results  Component Value Date   ETH <5 98/33/8250    Metabolic Disorder Labs: Lab Results  Component Value Date   HGBA1C 5.3 07/30/2016   MPG 105 07/30/2016   No results found for: PROLACTIN Lab Results  Component Value Date   CHOL 187 07/30/2016   TRIG 73 07/30/2016   HDL 85 07/30/2016   CHOLHDL 2.2 07/30/2016   VLDL 15 07/30/2016   LDLCALC 87 07/30/2016    Physical Findings: AIMS: Facial and Oral Movements Muscles of Facial Expression: None, normal Lips and Perioral Area: None, normal Jaw: None, normal Tongue: None, normal,Extremity Movements Upper (arms, wrists, hands, fingers): None, normal Lower (legs, knees, ankles, toes): None, normal, Trunk Movements Neck, shoulders, hips: None, normal, Overall Severity Severity of abnormal movements (highest score from questions above): None, normal Incapacitation due to abnormal movements: None, normal Patient's awareness of abnormal movements (rate only patient's report): No Awareness, Dental Status Current problems with teeth and/or dentures?: No Does patient usually wear dentures?: No  CIWA:    COWS:  COWS Total Score: 2  Musculoskeletal: Strength & Muscle Tone: within normal limits Gait & Station: normal Patient leans: N/A  Psychiatric Specialty Exam: Physical Exam  Nursing note and vitals reviewed. Psychiatric: His speech is normal. He is withdrawn. Cognition and memory are normal. He expresses impulsivity. He exhibits a depressed mood. He expresses suicidal ideation. He expresses suicidal plans.    Review of Systems  Psychiatric/Behavioral: Positive for depression, substance abuse and suicidal ideas. The patient is nervous/anxious and has insomnia.   All other systems reviewed and are negative.   Blood pressure 130/76, pulse 79, temperature 97.9 F (36.6 C), resp. rate 18, height _0   (1.626 m), weight 69.9 kg (154 lb), SpO2 100 %.Body mass index is 26.43 kg/m.  General Appearance: Casual  Eye Contact:  Good  Speech:  Clear and Coherent  Volume:  Normal  Mood:  Depressed  Affect:  Blunt  Thought Process:  Goal Directed and Descriptions of Associations: Intact  Orientation:  Full (Time, Place, and Person)  Thought Content:  WDL  Suicidal Thoughts:  Yes.  with intent/plan  Homicidal Thoughts:  No  Memory:  Immediate;   Fair Recent;   Fair Remote;   Fair  Judgement:  Impaired  Insight:  Shallow  Psychomotor Activity:  Psychomotor Retardation  Concentration:  Concentration: Fair and Attention Span: Fair  Recall:  [employer] of Knowledge:  Fair  Language:  Fair  Akathisia:  No  Handed:  Right  AIMS (if indicated):     Assets:  Communication Skills  Desire for Improvement Housing Physical Health Resilience Social Support  ADL's:  Intact  Cognition:  WNL  Sleep:  Number of Hours: 6.5     Treatment Plan Summary: Daily contact with patient to assess and evaluate symptoms and progress in treatment and Medication management   Mr. Bishara is a 31 year old male with history of bipolar disorder and alcoholism admitted for suicidal ideation with a plan to overdose on medications in the context of worsening of his symptoms, treatment noncompliance, and drinking.  1. Suicidal ideation. The patient is able to contract for safety in the hospital.   2. Mood. We will continue Abilify and Trileptal for mood stabilization.  3. Anxiety. We continue Minipress 1 mg twice daily for nightmares and flashbacks. Blood pressure is low.   4. Alcohol abuse. He reports daily drinking. There were no symptoms of alcohol withdrawal.  5. Substance abuse treatment. The patient is interested in residential treatment.   6. Insomnia. We discontinued Trazodone and started Seroquel. I am not sure if Seroquel is allowed at the [facility]  7. Metabolic syndrome monitoring.  Lipid panel, TSH and hemoglobin A1c are normal.   8. EKG. Normal sinus rhythm. QTC 392.   9. STDs. All tests are negative.  10. Disposition. TBA. He will follow up with [facility].  Orson Slick, MD 08/01/2016, 2:41 PM

## 2016-08-02 NOTE — [facility] Group Notes (Signed)
[facility] LCSW Group Therapy Note  Date/Time: 08/02/16, 0930  Type of Therapy and Topic:  Group Therapy:  Feelings around Relapse and Recovery  Participation Level:  Active   Mood: positive  Description of Group:    Patients in this group will discuss emotions they experience before and after a relapse. They will process how experiencing these feelings, or avoidance of experiencing them, relates to having a relapse. Facilitator will guide patients to explore emotions they have related to recovery. Patients will be encouraged to process which emotions are more powerful. They will be guided to discuss the emotional reaction significant others in their lives may have to patients' relapse or recovery. Patients will be assisted in exploring ways to respond to the emotions of others without this contributing to a relapse.  Therapeutic Goals: 1. Patient will identify two or more emotions that lead to relapse for them:  2. Patient will identify two emotions that result when they relapse:  3. Patient will identify two emotions related to recovery:  4. Patient will demonstrate ability to communicate their needs through discussion and/or role plays.   Summary of Patient Progress:  Pt very engaged in group today and shared that for him, difficulty in handling negative feelings has been a long standing problem.  He shared a number of situations regarding this and also interactions with family that have caused him problems in the past.  He identified one strategy for managing feelings as taking long drives.     Therapeutic Modalities:   Cognitive Behavioral Therapy Solution-Focused Therapy Assertiveness Training Relapse Prevention Therapy  Daleen Squibb, LCSW

## 2016-08-02 NOTE — Progress Notes (Signed)
Patient deemed appropriate for discharge by team. Reviewed discharge packet with him with good understanding. At time of discharge denies SI.HI.AVH, and has no complaints. He left with all belongings, a 7 day supply of medication, and prescriptions at 1250. He will drive himself to his uncles house.
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
## 2016-08-02 NOTE — Progress Notes (Signed)
  [facility] Adult Case Management Discharge Plan :  Will you be returning to the same living situation after discharge:  Yes,  with uncle At discharge, do you have transportation home?: Yes,  patient has car at hospital Do you have the ability to pay for your medications: Yes,  patient has insurance  Release of information consent forms completed and in the chart;  Patient's signature needed at discharge.  Patient to Follow up at: Follow-up Information    [facility] Follow up on 08/05/2016.   Why:  Follow-up appointment on this date at 8:00am for outpatient services including medication management and outpatient therapy. Bring Photo ID, insurance information, discharge summary, and current medications with you to this appointment.  Contact information: [address] [place] [address] [phone]           Next level of care provider has access to [facility] Link:no  Safety Planning and Suicide Prevention discussed: Yes,  with patient and mother.   Have you used any form of tobacco in the last 30 days? (Cigarettes, Smokeless Tobacco, Cigars, and/or Pipes): Yes  Has patient been referred to the Quitline?: Patient refused referral  Patient has been referred for addiction treatment: Yes  Deadrick Stidd G. Garnette Czech MSW, LCSWA 08/02/2016, 12:12 PM

## 2016-08-02 NOTE — Tx Team (Signed)
Interdisciplinary Treatment and Diagnostic Plan Update  08/02/2016 Time of Session: 10:30am Roger Hall MRN: [id number]  Principal Diagnosis: Bipolar I disorder, most recent episode depressed, severe without psychotic features (HCC)  Secondary Diagnoses: Principal Problem:   Bipolar I disorder, most recent episode depressed, severe without psychotic features (HCC) Active Problems:   Tobacco use disorder   PTSD (post-traumatic stress disorder)   Alcohol use disorder, moderate, dependence (HCC)   Current Medications:  Current Facility-Administered Medications  Medication Dose Route Frequency Provider Last Rate Last Dose  . acetaminophen (TYLENOL) tablet 650 mg  650 mg Oral Q6H PRN Shari Prows, MD   650 mg at 07/31/16 2134  . ARIPiprazole (ABILIFY) tablet 5 mg  5 mg Oral Daily Shari Prows, MD   5 mg at 08/02/16 0844  . fluticasone (FLONASE) 50 MCG/ACT nasal spray 2 spray  2 spray Each Nare Daily Shari Prows, MD   2 spray at 08/02/16 0844  . hydrOXYzine (ATARAX/VISTARIL) tablet 50 mg  50 mg Oral TID PRN Shari Prows, MD   50 mg at 07/30/16 0035  . loratadine (CLARITIN) tablet 10 mg  10 mg Oral Daily Jolanta B Pucilowska, MD   10 mg at 08/02/16 0845  . magnesium hydroxide (MILK OF MAGNESIA) suspension 30 mL  30 mL Oral Daily PRN Jolanta B Pucilowska, MD      . nicotine (NICODERM CQ - dosed in mg/24 hours) patch 21 mg  21 mg Transdermal Q0600 Shari Prows, MD   21 mg at 08/02/16 0844  . Oxcarbazepine (TRILEPTAL) tablet 300 mg  300 mg Oral BID Shari Prows, MD   300 mg at 08/02/16 0844  . prazosin (MINIPRESS) capsule 1 mg  1 mg Oral BID Shari Prows, MD   1 mg at 08/02/16 0844  . QUEtiapine (SEROQUEL) tablet 50 mg  50 mg Oral QHS Shari Prows, MD   50 mg at 08/01/16 2106   PTA Medications: Prescriptions Prior to Admission  Medication Sig Dispense Refill Last Dose  . aspirin (ASPIRIN CHILDRENS) 81 MG chewable tablet Chew 1  tablet (81 mg total) by mouth daily. (Patient not taking: Reported on 07/28/2016) 30 tablet 0 Not Taking at Unknown time  . prazosin (MINIPRESS) 1 MG capsule Take 1 capsule (1 mg total) by mouth at bedtime. (Patient not taking: Reported on 07/28/2016) 15 capsule 0 Not Taking at Unknown time  . [DISCONTINUED] ARIPiprazole (ABILIFY) 5 MG tablet Take 1 tablet (5 mg total) by mouth daily. (Patient not taking: Reported on 07/28/2016) 15 tablet 0 Not Taking at Unknown time    Patient Stressors: Marital or family conflict Medication change or noncompliance Occupational concerns Traumatic event  Patient Strengths: Ability for insight Capable of independent living Communication skills Motivation for treatment/growth  Treatment Modalities: Medication Management, Group therapy, Case management,  1 to 1 session with clinician, Psychoeducation, Recreational therapy.   Physician Treatment Plan for Primary Diagnosis: Bipolar I disorder, most recent episode depressed, severe without psychotic features (HCC) Long Term Goal(s): Improvement in symptoms so as ready for discharge Improvement in symptoms so as ready for discharge   Short Term Goals: Ability to identify changes in lifestyle to reduce recurrence of condition will improve Ability to verbalize feelings will improve Ability to disclose and discuss suicidal ideas Ability to demonstrate self-control will improve Ability to identify and develop effective coping behaviors will improve Ability to maintain clinical measurements within normal limits will improve Compliance with prescribed medications will improve Ability to identify triggers associated  with substance abuse/mental health issues will improve Ability to identify changes in lifestyle to reduce recurrence of condition will improve Ability to demonstrate self-control will improve Ability to identify triggers associated with substance abuse/mental health issues will improve  Medication  Management: Evaluate patient's response, side effects, and tolerance of medication regimen.  Therapeutic Interventions: 1 to 1 sessions, Unit Group sessions and Medication administration.  Evaluation of Outcomes: Adequate for Discharge  Physician Treatment Plan for Secondary Diagnosis: Principal Problem:   Bipolar I disorder, most recent episode depressed, severe without psychotic features (HCC) Active Problems:   Tobacco use disorder   PTSD (post-traumatic stress disorder)   Alcohol use disorder, moderate, dependence (HCC)  Long Term Goal(s): Improvement in symptoms so as ready for discharge Improvement in symptoms so as ready for discharge   Short Term Goals: Ability to identify changes in lifestyle to reduce recurrence of condition will improve Ability to verbalize feelings will improve Ability to disclose and discuss suicidal ideas Ability to demonstrate self-control will improve Ability to identify and develop effective coping behaviors will improve Ability to maintain clinical measurements within normal limits will improve Compliance with prescribed medications will improve Ability to identify triggers associated with substance abuse/mental health issues will improve Ability to identify changes in lifestyle to reduce recurrence of condition will improve Ability to demonstrate self-control will improve Ability to identify triggers associated with substance abuse/mental health issues will improve     Medication Management: Evaluate patient's response, side effects, and tolerance of medication regimen.  Therapeutic Interventions: 1 to 1 sessions, Unit Group sessions and Medication administration.  Evaluation of Outcomes: Adequate for Discharge   RN Treatment Plan for Primary Diagnosis: Bipolar I disorder, most recent episode depressed, severe without psychotic features (HCC) Long Term Goal(s): Knowledge of disease and therapeutic regimen to maintain health will improve  Short  Term Goals: Ability to verbalize feelings will improve, Ability to identify and develop effective coping behaviors will improve and Compliance with prescribed medications will improve  Medication Management: RN will administer medications as ordered by provider, will assess and evaluate patient's response and provide education to patient for prescribed medication. RN will report any adverse and/or side effects to prescribing provider.  Therapeutic Interventions: 1 on 1 counseling sessions, Psychoeducation, Medication administration, Evaluate responses to treatment, Monitor vital signs and CBGs as ordered, Perform/monitor CIWA, COWS, AIMS and Fall Risk screenings as ordered, Perform wound care treatments as ordered.  Evaluation of Outcomes: Adequate for Discharge   LCSW Treatment Plan for Primary Diagnosis: Bipolar I disorder, most recent episode depressed, severe without psychotic features (HCC) Long Term Goal(s): Safe transition to appropriate next level of care at discharge, Engage patient in therapeutic group addressing interpersonal concerns.  Short Term Goals: Engage patient in aftercare planning with referrals and resources, Increase social support and Increase skills for wellness and recovery  Therapeutic Interventions: Assess for all discharge needs, 1 to 1 time with Social worker, Explore available resources and support systems, Assess for adequacy in community support network, Educate family and significant other(s) on suicide prevention, Complete Psychosocial Assessment, Interpersonal group therapy.  Evaluation of Outcomes: Adequate for Discharge   Progress in Treatment: Attending groups: Yes. Participating in groups: Yes. Taking medication as prescribed: Yes. Toleration medication: Yes. Family/Significant other contact made: Yes, individual(s) contacted:  mother Patient understands diagnosis: Yes. Discussing patient identified problems/goals with staff: Yes. Medical problems  stabilized or resolved: Yes. Denies suicidal/homicidal ideation: Yes. Issues/concerns per patient self-inventory: No. Other: n/a  New problem(s) identified: None identified at this  time.   New Short Term/Long Term Goal(s): None identified at this time.   Discharge Plan or Barriers: Patient will discharge home and follow-up with [facility] for outpatient services including medication management and outpatient therapy.   Reason for Continuation of Hospitalization: Anticipated discharge 08/02/2016  Estimated Length of Stay: Anticipated discharge 08/02/2016  Attendees: Patient: 08/02/2016 12:13 PM  Physician: Dr. Kristine Linea, MD 08/02/2016 12:13 PM  Nursing: Leonia Reader, RN 08/02/2016 12:13 PM  RN Care Manager: 08/02/2016 12:13 PM  Social Worker: Fredrich Birks. Garnette Czech MSW, LCSWA 08/02/2016 12:13 PM  Recreational Therapist:  08/02/2016 12:13 PM  Other:  08/02/2016 12:13 PM  Other:  08/02/2016 12:13 PM  Other: 08/02/2016 12:13 PM    Scribe for Treatment Team: Arelia Longest, LCSWA 08/02/2016 12:16 PM

## 2016-08-02 NOTE — Progress Notes (Signed)
Patient ID: Roger Hall, male   DOB: 02/11/1986, 31 y.o.   MRN: [id number] Brighter affect, appropriate, medication compliant, interacting well with peers and attending group; denied SI/HI, denied AV.H, no behavioral problems during this shift.

## 2016-08-02 NOTE — Plan of Care (Signed)
Problem: Activity: Goal: Sleeping patterns will improve Outcome: Progressing Patient slept for Estimated Hours of 7; every 15 minutes safety round maintained, no injury or falls during this shift.    

## 2016-08-02 NOTE — [facility] Group Notes (Signed)
[facility] Group Notes:  (Nursing/MHT/Case Management/Adjunct)  Date:  08/02/2016  Time:  12:12 AM  Type of Therapy:  Psychoeducational Skills  Participation Level:  Active  Participation Quality:  Appropriate and Sharing  Affect:  Appropriate  Cognitive:  Appropriate  Insight:  Appropriate and Good  Engagement in Group:  Engaged  Modes of Intervention:  Discussion, Socialization and Support  Summary of Progress/Problems:  Roger Hall 08/02/2016, 12:12 AM

## 2016-08-02 NOTE — [facility] Suicide Risk Assessment (Signed)
[facility] Discharge Suicide Risk Assessment   Principal Problem: Bipolar I disorder, most recent episode depressed, severe without psychotic features [facility]) Discharge Diagnoses:  Patient Active Problem List   Diagnosis Date Noted  . Tobacco use disorder [F17.200] 07/29/2016  . PTSD (post-traumatic stress disorder) [F43.10] 07/29/2016  . Alcohol use disorder, moderate, dependence (HCC) [F10.20] 07/29/2016  . Bipolar I disorder, most recent episode depressed, severe without psychotic features (HCC) [F31.4] 07/29/2016    Total Time spent with patient: 30 minutes  Musculoskeletal: Strength & Muscle Tone: within normal limits Gait & Station: normal Patient leans: N/A  Psychiatric Specialty Exam: Review of Systems  Psychiatric/Behavioral: Positive for substance abuse.  All other systems reviewed and are negative.   Blood pressure 122/76, pulse 86, temperature 98.1 F (36.7 C), temperature source Oral, resp. rate 18, height  (1.626 m), weight 69.9 kg (154 lb), SpO2 100 %.Body mass index is 26.43 kg/m.  General Appearance: Casual  Eye Contact::  Good  Speech:  Clear and Coherent409  Volume:  Normal  Mood:  Euthymic  Affect:  Appropriate  Thought Process:  Goal Directed and Descriptions of Associations: Intact  Orientation:  Full (Time, Place, and Person)  Thought Content:  WDL  Suicidal Thoughts:  No  Homicidal Thoughts:  No  Memory:  Immediate;   Fair Recent;   Fair Remote;   Fair  Judgement:  Impaired  Insight:  Shallow  Psychomotor Activity:  Normal  Concentration:  Fair  Recall:  [employer] of Knowledge:Fair  Language: Fair  Akathisia:  No  Handed:  Right  AIMS (if indicated):     Assets:  Communication Skills Desire for Improvement Physical Health Resilience Social Support  Sleep:  Number of Hours: 7  Cognition: WNL  ADL's:  Intact   Mental Status Per Nursing Assessment::   On Admission:  NA  Demographic Factors:  Male, Adolescent or young adult, Low  socioeconomic status and Unemployed  Loss Factors: Loss of significant relationship and Financial problems/change in socioeconomic status  Historical Factors: Prior suicide attempts, Family history of mental illness or substance abuse and Impulsivity  Risk Reduction Factors:   Responsible for children under 42 years of age, Sense of responsibility to family and Positive social support  Continued Clinical Symptoms:  Bipolar Disorder:   Depressive phase Alcohol/Substance Abuse/Dependencies  Cognitive Features That Contribute To Risk:  None    Suicide Risk:  Minimal: No identifiable suicidal ideation.  Patients presenting with no risk factors but with morbid ruminations; may be classified as minimal risk based on the severity of the depressive symptoms  Follow-up Information    [facility] Follow up on 08/05/2016.   Why:  Follow-up appointment on this date at 8:00am for outpatient services including medication management and outpatient therapy. Bring Photo ID, insurance information, discharge summary, and current medications with you to this appointment.  Contact information: [address] [place] [address] [phone]           Plan Of Care/Follow-up recommendations:  Activity:  as tolerated. Diet:  low sodium heart healthy. Other:  keep follow up appointments.  Kristine Linea, MD 08/02/2016, 9:36 AM

## 2016-08-25 ENCOUNTER — Emergency Department
Admission: EM | Admit: 2016-08-25 | Discharge: 2016-08-25 | Disposition: A | Discharge status: Home / Self Care | Payer: BLUE CROSS/BLUE SHIELD | Attending: Emergency Medicine | Admitting: Emergency Medicine

## 2016-08-25 ENCOUNTER — Emergency Department: Payer: BLUE CROSS/BLUE SHIELD

## 2016-08-25 DIAGNOSIS — F17210 Nicotine dependence, cigarettes, uncomplicated: Secondary | ICD-10-CM | POA: Insufficient documentation

## 2016-08-25 DIAGNOSIS — Y999 Unspecified external cause status: Secondary | ICD-10-CM | POA: Insufficient documentation

## 2016-08-25 DIAGNOSIS — W230XXA Caught, crushed, jammed, or pinched between moving objects, initial encounter: Secondary | ICD-10-CM | POA: Insufficient documentation

## 2016-08-25 DIAGNOSIS — S62524A Nondisplaced fracture of distal phalanx of right thumb, initial encounter for closed fracture: Secondary | ICD-10-CM | POA: Insufficient documentation

## 2016-08-25 DIAGNOSIS — Y939 Activity, unspecified: Secondary | ICD-10-CM | POA: Insufficient documentation

## 2016-08-25 DIAGNOSIS — Y929 Unspecified place or not applicable: Secondary | ICD-10-CM | POA: Insufficient documentation

## 2016-08-25 DIAGNOSIS — S62669A Nondisplaced fracture of distal phalanx of unspecified finger, initial encounter for closed fracture: Secondary | ICD-10-CM

## 2016-08-25 MED ORDER — HYDROCODONE-ACETAMINOPHEN 5-325 MG PO TABS: 1.0000 | ORAL_TABLET | Freq: Four times a day (QID) | ORAL | 0 refills | Status: DC | PRN

## 2016-08-25 MED ORDER — BACITRACIN ZINC 500 UNIT/GM EX OINT
TOPICAL_OINTMENT | Freq: Once | CUTANEOUS | Status: AC
  Administered 2016-08-25: 1 via TOPICAL
  Filled 2016-08-25: qty 0.9

## 2016-08-25 NOTE — ED Notes (Signed)
Patient states he does not want to file workmans comp.

## 2016-08-25 NOTE — ED Triage Notes (Signed)
Pt c/o right thumb pain following work related injury yesterday. States machinery fell onto thumb. Reports pain since last night with swelling. DENIES WORKERS COMP CLAIM.

## 2016-08-25 NOTE — ED Provider Notes (Signed)
[facility] Emergency Department Provider Note  ____________________________________________  Time seen: Approximately 3:58 PM  I have reviewed the triage vital signs and the nursing notes.   HISTORY  Chief Complaint Hand Pain    HPI Roger Hall is a 31 y.o. male that presents to the emergency department with right thumb pain after getting finger caught  in a stamping machine at work yesterday. He did not think it is broken but finger has continued to be painful and stay swollen.Patient started this new job 2 weeks ago. Last tetanus shot was when he started his job 2 weeks ago. He denies any additional injuries. No headache, shortness of breath, chest pain, nausea, vomiting, abdominal pain.   Past Medical History:  Diagnosis Date  . Depression     Patient Active Problem List   Diagnosis Date Noted  . Tobacco use disorder 07/29/2016  . PTSD (post-traumatic stress disorder) 07/29/2016  . Alcohol use disorder, moderate, dependence (HCC) 07/29/2016  . Bipolar I disorder, most recent episode depressed, severe without psychotic features (HCC) 07/29/2016    No past surgical history on file.  Prior to Admission medications   Medication Sig Start Date End Date Taking? Authorizing Provider  ARIPiprazole (ABILIFY) 5 MG tablet Take 1 tablet (5 mg total) by mouth daily. 07/31/16   Shari Prows, MD  HYDROcodone-acetaminophen (NORCO/VICODIN) 5-325 MG tablet Take 1 tablet by mouth every 6 (six) hours as needed for moderate pain. 08/25/16   Enid Derry, PA-C  Oxcarbazepine (TRILEPTAL) 300 MG tablet Take 1 tablet (300 mg total) by mouth 2 (two) times daily. 07/31/16   Shari Prows, MD  prazosin (MINIPRESS) 1 MG capsule Take 1 capsule (1 mg total) by mouth 2 (two) times daily. 07/31/16   Shari Prows, MD  QUEtiapine (SEROQUEL) 50 MG tablet Take 1 tablet (50 mg total) by mouth at bedtime. 08/01/16   Shari Prows, MD  traZODone (DESYREL)  100 MG tablet Take 1 tablet (100 mg total) by mouth at bedtime. 07/31/16   Shari Prows, MD    Allergies Patient has no known allergies.  Family History  Problem Relation Age of Onset  . Depression Mother     Social History Social History  Substance Use Topics  . Smoking status: Current Every Day Smoker    Packs/day: 0.50    Years: 10.00    Types: Cigarettes  . Smokeless tobacco: Never Used  . Alcohol use 3.6 oz/week    6 Cans of beer per week     Review of Systems  Constitutional: No fever/chills Cardiovascular: No chest pain. Respiratory:  No SOB. Gastrointestinal: No abdominal pain.  No nausea, no vomiting.  Musculoskeletal: Positive for thumb pain. Skin: Negative for rash, ecchymosis. Neurological: Negative for headaches, numbness or tingling   ____________________________________________   PHYSICAL EXAM:  VITAL SIGNS: ED Triage Vitals  Enc Vitals Group     BP 08/25/16 1050 137/74     Pulse Rate 08/25/16 1050 81     Resp 08/25/16 1050 18     Temp 08/25/16 1050 97.6 F (36.4 C)     Temp Source 08/25/16 1050 Oral     SpO2 08/25/16 1050 97 %     Weight 08/25/16 1050 153 lb (69.4 kg)     Height 08/25/16 1050  (1.626 m)     Head Circumference --      Peak Flow --      Pain Score 08/25/16 1057 10     Pain Loc --  Pain Edu? --      Excl. in GC? --      Constitutional: Alert and oriented. Well appearing and in no acute distress. Eyes: Conjunctivae are normal. PERRL. EOMI. Head: Atraumatic. ENT:      Ears:      Nose: No congestion/rhinnorhea.      Mouth/Throat: Mucous membranes are moist.  Neck: No stridor.   Cardiovascular: Normal rate, regular rhythm.  Good peripheral circulation. 2+ radial pulses. Respiratory: Normal respiratory effort without tachypnea or retractions. Lungs CTAB. Good air entry to the bases with no decreased or absent breath sounds. Musculoskeletal: Full range of motion to all extremities. No gross deformities  appreciated.Tender to palpation over distal right thumb. 1/2 cm shallow abrasion at base of the fingernail. No swelling. No drainage. Neurologic:  Normal speech and language. No gross focal neurologic deficits are appreciated.  Skin:  Skin is warm, dry and intact. No rash noted.   ____________________________________________   LABS (all labs ordered are listed, but only abnormal results are displayed)  Labs Reviewed - No data to display ____________________________________________  EKG   ____________________________________________  RADIOLOGY Lexine Baton, personally viewed and evaluated these images (plain radiographs) as part of my medical decision making, as well as reviewing the written report by the radiologist.  Dg Finger Thumb Right  Result Date: 08/25/2016 CLINICAL DATA:  Patient with injury to the thumb. Initial encounter. EXAM: RIGHT THUMB 2+V COMPARISON:  None. FINDINGS: There is a crescentic density about the distal aspect of the distal phalanx of the thumb. Overlying soft tissue swelling. Remainder of the exam is unremarkable. IMPRESSION: Small crescentic calcific density compatible with fracture of the distal aspect of the distal phalanx of the thumb. Electronically Signed   By: Annia Belt M.D.   On: 08/25/2016 11:36    ____________________________________________    PROCEDURES  Procedure(s) performed:    Procedures    Medications  bacitracin ointment (1 application Topical Given 08/25/16 1301)     ____________________________________________   INITIAL IMPRESSION / ASSESSMENT AND PLAN / ED COURSE  Pertinent labs & imaging results that were available during my care of the patient were reviewed by me and considered in my medical decision making (see chart for details).  Review of the [place] CSRS was performed in accordance of the [facility] prior to dispensing any controlled drugs.     Patient's diagnosis is consistent with fracture of distal phalanx.  Vital signs and exam are reassuring. X-ray consistent with finger fracture. Bacitracin was applied to shallow laceration. No indication for repair. I do not expect this to get infected. Patient is to follow up with PCP as directed. Patient is given ED precautions to return to the ED for any worsening or new symptoms.     ____________________________________________  FINAL CLINICAL IMPRESSION(S) / ED DIAGNOSES  Final diagnoses:  Closed nondisplaced fracture of distal phalanx of finger, unspecified finger, initial encounter      NEW MEDICATIONS STARTED DURING THIS VISIT:  Discharge Medication List as of 08/25/2016  1:10 PM          This chart was dictated using voice recognition software/Dragon. Despite best efforts to proofread, errors can occur which can change the meaning. Any change was purely unintentional.    Enid Derry, PA-C 08/25/16 1602    Arnaldo Natal, MD 08/27/16 Mikle Bosworth

## 2016-08-25 NOTE — ED Notes (Signed)
Pt returning from radiology. Pt ambulatory and in NAD.

## 2016-08-25 NOTE — ED Notes (Signed)
Pt refusing to file worker's comp claim per pt request.

## 2016-11-10 ENCOUNTER — Emergency Department: Payer: BLUE CROSS/BLUE SHIELD

## 2016-11-10 ENCOUNTER — Emergency Department
Admission: EM | Admit: 2016-11-10 | Discharge: 2016-11-10 | Disposition: A | Discharge status: Home / Self Care | Payer: BLUE CROSS/BLUE SHIELD | Attending: Emergency Medicine | Admitting: Emergency Medicine

## 2016-11-10 ENCOUNTER — Encounter: Payer: Self-pay | Admitting: Emergency Medicine

## 2016-11-10 DIAGNOSIS — J181 Lobar pneumonia, unspecified organism: Secondary | ICD-10-CM | POA: Insufficient documentation

## 2016-11-10 DIAGNOSIS — F17210 Nicotine dependence, cigarettes, uncomplicated: Secondary | ICD-10-CM | POA: Insufficient documentation

## 2016-11-10 DIAGNOSIS — Z79899 Other long term (current) drug therapy: Secondary | ICD-10-CM | POA: Insufficient documentation

## 2016-11-10 DIAGNOSIS — R0981 Nasal congestion: Secondary | ICD-10-CM | POA: Insufficient documentation

## 2016-11-10 DIAGNOSIS — J189 Pneumonia, unspecified organism: Secondary | ICD-10-CM

## 2016-11-10 DIAGNOSIS — J45909 Unspecified asthma, uncomplicated: Secondary | ICD-10-CM | POA: Insufficient documentation

## 2016-11-10 DIAGNOSIS — R079 Chest pain, unspecified: Secondary | ICD-10-CM | POA: Insufficient documentation

## 2016-11-10 DIAGNOSIS — M791 Myalgia: Secondary | ICD-10-CM | POA: Insufficient documentation

## 2016-11-10 HISTORY — DX: Unspecified asthma, uncomplicated: J45.909

## 2016-11-10 MED ORDER — ALBUTEROL SULFATE HFA 108 (90 BASE) MCG/ACT IN AERS
2.0000 | INHALATION_SPRAY | Freq: Four times a day (QID) | RESPIRATORY_TRACT | 0 refills | Status: AC | PRN
Start: 2016-11-10 — End: ?

## 2016-11-10 MED ORDER — DOXYCYCLINE HYCLATE 50 MG PO CAPS: 100.0000 mg | ORAL_CAPSULE | Freq: Two times a day (BID) | ORAL | 0 refills | Status: DC

## 2016-11-10 MED ORDER — AMOXICILLIN-POT CLAVULANATE 875-125 MG PO TABS: 1.0000 | ORAL_TABLET | Freq: Two times a day (BID) | ORAL | 0 refills | Status: DC

## 2016-11-10 MED ORDER — AZITHROMYCIN 250 MG PO TABS: ORAL_TABLET | ORAL | 0 refills | Status: DC

## 2016-11-10 MED ORDER — PREDNISONE 10 MG PO TABS: ORAL_TABLET | ORAL | 0 refills | Status: DC

## 2016-11-10 MED ORDER — ALBUTEROL SULFATE HFA 108 (90 BASE) MCG/ACT IN AERS: 2.0000 | INHALATION_SPRAY | Freq: Four times a day (QID) | RESPIRATORY_TRACT | 0 refills | Status: DC | PRN

## 2016-11-10 NOTE — ED Provider Notes (Signed)
[facility] Emergency Department Provider Note  ____________________________________________  Time seen: Approximately 3:16 PM  I have reviewed the triage vital signs and the nursing notes.   HISTORY  Chief Complaint Cough; Nasal Congestion; and Generalized Body Aches    HPI Roger Hall is a 31 y.o. male presents to the emergency department with nasal congestion, sore throat, nonproductive cough for 2 days. It hurts in his chest when he coughs. Patient states that his coworker is sick with similar symptoms.He smokes half a pack to a pack of cigarettes per day. States that he smokes more and he drinks. Patient used to have asthma but he grew out of it. He does not use any inhalers currently. He is eating and drinking normally. No alleviating measures have been attempted.  He denies fever, shortness of breath, nausea, vomiting, abdominal pain.   Past Medical History:  Diagnosis Date  . Asthma   . Depression     Patient Active Problem List   Diagnosis Date Noted  . Tobacco use disorder 07/29/2016  . PTSD (post-traumatic stress disorder) 07/29/2016  . Alcohol use disorder, moderate, dependence (HCC) 07/29/2016  . Bipolar I disorder, most recent episode depressed, severe without psychotic features (HCC) 07/29/2016    History reviewed. No pertinent surgical history.  Prior to Admission medications   Medication Sig Start Date End Date Taking? Authorizing Provider  albuterol (PROVENTIL HFA;VENTOLIN HFA) 108 (90 Base) MCG/ACT inhaler Inhale 2 puffs into the lungs every 6 (six) hours as needed for wheezing or shortness of breath. 11/10/16   Enid Derry, PA-C  ARIPiprazole (ABILIFY) 5 MG tablet Take 1 tablet (5 mg total) by mouth daily. 07/31/16   Pucilowska, Jolanta B, MD  azithromycin (ZITHROMAX Z-PAK) 250 MG tablet Take 2 tablets (500 mg) on  Day 1,  followed by 1 tablet (250 mg) once daily on Days 2 through 5. 11/10/16   Enid Derry, PA-C   HYDROcodone-acetaminophen (NORCO/VICODIN) 5-325 MG tablet Take 1 tablet by mouth every 6 (six) hours as needed for moderate pain. 08/25/16   Enid Derry, PA-C  Oxcarbazepine (TRILEPTAL) 300 MG tablet Take 1 tablet (300 mg total) by mouth 2 (two) times daily. 07/31/16   Pucilowska, Braulio Conte B, MD  prazosin (MINIPRESS) 1 MG capsule Take 1 capsule (1 mg total) by mouth 2 (two) times daily. 07/31/16   Pucilowska, Ellin Goodie, MD  predniSONE (DELTASONE) 10 MG tablet Take 6 tablets on day 1, take 5 tablets on day 2, take 4 tablets on day 3, take 3 tablets on day 4, take 2 tablets on day 5, take 1 tablet on day 6 11/10/16   Enid Derry, PA-C  QUEtiapine (SEROQUEL) 50 MG tablet Take 1 tablet (50 mg total) by mouth at bedtime. 08/01/16   Pucilowska, Braulio Conte B, MD  traZODone (DESYREL) 100 MG tablet Take 1 tablet (100 mg total) by mouth at bedtime. 07/31/16   Pucilowska, Ellin Goodie, MD    Allergies Patient has no known allergies.  Family History  Problem Relation Age of Onset  . Depression Mother     Social History Social History  Substance Use Topics  . Smoking status: Current Every Day Smoker    Packs/day: 0.50    Years: 10.00    Types: Cigarettes  . Smokeless tobacco: Never Used  . Alcohol use 3.6 oz/week    6 Cans of beer per week     Comment: daily     Review of Systems  Constitutional: No fever/chills Eyes: No visual changes. No discharge. ENT:  Positive for congestion and rhinorrhea. Respiratory: Positive for cough. No SOB. Gastrointestinal: No abdominal pain.  No nausea, no vomiting.  No diarrhea.  No constipation. Skin: Negative for rash, abrasions, lacerations, ecchymosis. Neurological: Negative for headaches.   ____________________________________________   PHYSICAL EXAM:  VITAL SIGNS: ED Triage Vitals [11/10/16 1419]  Enc Vitals Group     BP 136/88     Pulse Rate 81     Resp 18     Temp 99.6 F (37.6 C)     Temp Source Oral     SpO2 100 %     Weight 153 lb (69.4  kg)     Height 5\' 4"  (1.626 m)     Head Circumference      Peak Flow      Pain Score 8     Pain Loc      Pain Edu?      Excl. in GC?      Constitutional: Alert and oriented. Well appearing and in no acute distress. Eyes: Conjunctivae are normal. PERRL. EOMI. No discharge. Head: Atraumatic. ENT: No frontal and maxillary sinus tenderness.      Ears: Tympanic membranes pearly gray with good landmarks. No discharge.      Nose: Mild congestion/rhinnorhea.      Mouth/Throat: Mucous membranes are moist. Oropharynx non-erythematous. Tonsils not enlarged. No exudates. Uvula midline. Neck: No stridor.   Hematological/Lymphatic/Immunilogical: No cervical lymphadenopathy. Cardiovascular: Normal rate, regular rhythm.  Good peripheral circulation. Respiratory: Normal respiratory effort without tachypnea or retractions. Lungs CTAB. Good air entry to the bases with no decreased or absent breath sounds. Gastrointestinal: Bowel sounds 4 quadrants. Soft and nontender to palpation. No guarding or rigidity. No palpable masses. No distention. Musculoskeletal: Full range of motion to all extremities. No gross deformities appreciated. Neurologic:  Normal speech and language. No gross focal neurologic deficits are appreciated.  Skin:  Skin is warm, dry and intact. No rash noted. Psychiatric: Mood and affect are normal. Speech and behavior are normal. Patient exhibits appropriate insight and judgement.   ____________________________________________   LABS (all labs ordered are listed, but only abnormal results are displayed)  Labs Reviewed - No data to display ____________________________________________  EKG   ____________________________________________  RADIOLOGY Lexine Baton, personally viewed and evaluated these images (plain radiographs) as part of my medical decision making, as well as reviewing the written report by the radiologist.  Dg Chest 2 View  Result Date: 11/10/2016 CLINICAL  DATA:  Shortness of breath.  Cough. EXAM: CHEST  2 VIEW COMPARISON:  06/24/2016 FINDINGS: Midline trachea. Normal heart size and mediastinal contours. No pleural effusion or pneumothorax. Moderate pulmonary interstitial thickening is again identified. Suspect patchy left perihilar and lower lobe airspace disease, only readily apparent on the frontal radiograph. IMPRESSION: Suspect subtle left-sided airspace disease/ pneumonia. This is superimposed upon moderate central airway thickening, likely related to chronic bronchitis/smoking. Electronically Signed   By: Jeronimo Greaves M.D.   On: 11/10/2016 14:41    ____________________________________________    PROCEDURES  Procedure(s) performed:    Procedures    Medications - No data to display   ____________________________________________   INITIAL IMPRESSION / ASSESSMENT AND PLAN / ED COURSE  Pertinent labs & imaging results that were available during my care of the patient were reviewed by me and considered in my medical decision making (see chart for details).  Review of the [place] CSRS was performed in accordance of the [facility] prior to dispensing any controlled drugs.  Patient's diagnosis is consistent with pneumonia.  Vital signs and exam are reassuring. No changes on EKG. X-ray consistent with subtle pneumonia and chronic bronchitis. Education about stopping smoking was provided. Patient appears well and is staying well hydrated.  Patient feels comfortable going home. Patient will be discharged home with prescriptions for azithromycin, prednisone, albuterol inhaler. Patient does not take Quetiapine. Patient is to follow up with PCP as needed or otherwise directed. Patient is given ED precautions to return to the ED for any worsening or new symptoms.     ____________________________________________  FINAL CLINICAL IMPRESSION(S) / ED DIAGNOSES  Final diagnoses:  Community acquired pneumonia of left lower lobe of lung (HCC)      NEW  MEDICATIONS STARTED DURING THIS VISIT:  Discharge Medication List as of 11/10/2016  3:11 PM    START taking these medications   Details  azithromycin (ZITHROMAX Z-PAK) 250 MG tablet Take 2 tablets (500 mg) on  Day 1,  followed by 1 tablet (250 mg) once daily on Days 2 through 5., Print    albuterol (PROVENTIL HFA;VENTOLIN HFA) 108 (90 Base) MCG/ACT inhaler Inhale 2 puffs into the lungs every 6 (six) hours as needed for wheezing or shortness of breath., Starting Sun 11/10/2016, Print    predniSONE (DELTASONE) 10 MG tablet Take 6 tablets on day 1, take 5 tablets on day 2, take 4 tablets on day 3, take 3 tablets on day 4, take 2 tablets on day 5, take 1 tablet on day 6, Print            This chart was dictated using voice recognition software/Dragon. Despite best efforts to proofread, errors can occur which can change the meaning. Any change was purely unintentional.    Enid DerryWagner, Evonne Rinks, PA-C 11/10/16 1520    Phineas SemenGoodman, [place], MD 11/11/16 [phone]

## 2016-11-10 NOTE — ED Provider Notes (Signed)
EKG interpreted by me Sinus rhythm rate of 78, rightward axis. Normal intervals. Normal QRS ST segments and T waves.   Roger CheekStafford, [name], MD 11/10/16 [phone]

## 2016-11-10 NOTE — ED Triage Notes (Signed)
Patient presents to the ED with cough, congestion and sore throat since yesterday.  Patient also reports aching chest and back today.  Patient is unsure of whether he has had a fever.  Patient reports someone he works with has had similar symptoms.

## 2016-11-11 ENCOUNTER — Emergency Department
Admission: EM | Admit: 2016-11-11 | Discharge: 2016-11-11 | Disposition: A | Discharge status: Home / Self Care | Payer: BLUE CROSS/BLUE SHIELD | Attending: Emergency Medicine | Admitting: Emergency Medicine

## 2016-11-11 ENCOUNTER — Encounter: Payer: Self-pay | Admitting: Emergency Medicine

## 2016-11-11 ENCOUNTER — Other Ambulatory Visit: Payer: Self-pay

## 2016-11-11 DIAGNOSIS — J189 Pneumonia, unspecified organism: Secondary | ICD-10-CM

## 2016-11-11 DIAGNOSIS — R079 Chest pain, unspecified: Secondary | ICD-10-CM

## 2016-11-11 LAB — BASIC METABOLIC PANEL
ANION GAP: 8 (ref 5–15)
BUN: 12 mg/dL (ref 6–20)
CHLORIDE: 101 mmol/L (ref 101–111)
CO2: 29 mmol/L (ref 22–32)
CREATININE: 1.08 mg/dL (ref 0.61–1.24)
Calcium: 9.2 mg/dL (ref 8.9–10.3)
GFR calc non Af Amer: >60 mL/min (ref >60)
Glucose, Bld: 103 mg/dL — ABNORMAL HIGH (ref 65–99)
Potassium: 4.4 mmol/L (ref 3.5–5.1)
SODIUM: 138 mmol/L (ref 135–145)

## 2016-11-11 LAB — CBC WITH DIFFERENTIAL/PLATELET
BASOS ABS: 0.1 10*3/uL (ref 0–0.1)
BASOS PCT: 1 %
EOS ABS: 0.2 10*3/uL (ref 0–0.7)
Eosinophils Relative: 3 %
HEMATOCRIT: 44.2 % (ref 40.0–52.0)
HEMOGLOBIN: 14.5 g/dL (ref 13.0–18.0)
Lymphocytes Relative: 17 %
Lymphs Abs: 1.3 10*3/uL (ref 1.0–3.6)
MCH: 29.2 pg (ref 26.0–34.0)
MCHC: 32.7 g/dL (ref 32.0–36.0)
MCV: 89.4 fL (ref 80.0–100.0)
MONOS PCT: 13 %
Monocytes Absolute: 1 10*3/uL (ref 0.2–1.0)
NEUTROS ABS: 4.8 10*3/uL (ref 1.4–6.5)
NEUTROS PCT: 66 %
Platelets: 267 10*3/uL (ref 150–440)
RBC: 4.95 MIL/uL (ref 4.40–5.90)
RDW: 13.8 % (ref 11.5–14.5)
WBC: 7.5 10*3/uL (ref 3.8–10.6)

## 2016-11-11 LAB — TROPONIN I

## 2016-11-11 NOTE — ED Triage Notes (Signed)
Pt presents to ED with sob and left sided chest / rib pain. Pt was dx with pneumonia yesterday in this ED and was prescribed antibiotics; unable to get his prescription filled. Pt states he had been coughing and congested since Thursday. Worsening sharp left sided chest / rib pain. Pain worsens when taking a deep breath. Pt states he thought he was going to get better but he feels worse instead.

## 2016-11-11 NOTE — ED Provider Notes (Signed)
[facility] Emergency Department Provider Note   ____________________________________________   First MD Initiated Contact with Patient 11/11/16 [phone]     (approximate)  I have reviewed the triage vital signs and the nursing notes.   HISTORY  Chief Complaint Cough   HPI Roger Hall is a 31 y.o. male who comes into the hospital today with some chest and back pain. The patient reports that the symptoms started Thursday. He reports that he's been coughing for a while and it seems as though he is wheezing. The patient was seen earlier today and was diagnosed with left-sided patchy pneumonia. He reports that he kept having pain on the left side so he decided to come in. The patient was given antibiotics and medicine but he did not fill his prescriptions. He reports that he recently started a job so he was unable to get them. The patient reports that he had only been taking Alka-Seltzer and he hadn't taken it since he was seen earlier today. Patient states that the pain is not very severe at the moment. He reports that the pain is worse when he is coughing. He is here for evaluation.   Past Medical History:  Diagnosis Date  . Asthma   . Depression     Patient Active Problem List   Diagnosis Date Noted  . Tobacco use disorder 07/29/2016  . PTSD (post-traumatic stress disorder) 07/29/2016  . Alcohol use disorder, moderate, dependence (HCC) 07/29/2016  . Bipolar I disorder, most recent episode depressed, severe without psychotic features (HCC) 07/29/2016    History reviewed. No pertinent surgical history.  Prior to Admission medications   Medication Sig Start Date End Date Taking? Authorizing Provider  albuterol (PROVENTIL HFA;VENTOLIN HFA) 108 (90 Base) MCG/ACT inhaler Inhale 2 puffs into the lungs every 6 (six) hours as needed for wheezing or shortness of breath. 11/10/16   Enid Derry, PA-C  ARIPiprazole (ABILIFY) 5 MG tablet Take 1 tablet (5 mg total)  by mouth daily. 07/31/16   Pucilowska, Jolanta B, MD  azithromycin (ZITHROMAX Z-PAK) 250 MG tablet Take 2 tablets (500 mg) on  Day 1,  followed by 1 tablet (250 mg) once daily on Days 2 through 5. 11/10/16   Enid Derry, PA-C  HYDROcodone-acetaminophen (NORCO/VICODIN) 5-325 MG tablet Take 1 tablet by mouth every 6 (six) hours as needed for moderate pain. 08/25/16   Enid Derry, PA-C  Oxcarbazepine (TRILEPTAL) 300 MG tablet Take 1 tablet (300 mg total) by mouth 2 (two) times daily. 07/31/16   Pucilowska, Braulio Conte B, MD  prazosin (MINIPRESS) 1 MG capsule Take 1 capsule (1 mg total) by mouth 2 (two) times daily. 07/31/16   Pucilowska, Ellin Goodie, MD  predniSONE (DELTASONE) 10 MG tablet Take 6 tablets on day 1, take 5 tablets on day 2, take 4 tablets on day 3, take 3 tablets on day 4, take 2 tablets on day 5, take 1 tablet on day 6 11/10/16   Enid Derry, PA-C  QUEtiapine (SEROQUEL) 50 MG tablet Take 1 tablet (50 mg total) by mouth at bedtime. 08/01/16   Pucilowska, Braulio Conte B, MD  traZODone (DESYREL) 100 MG tablet Take 1 tablet (100 mg total) by mouth at bedtime. 07/31/16   Pucilowska, Ellin Goodie, MD    Allergies Patient has no known allergies.  Family History  Problem Relation Age of Onset  . Depression Mother     Social History Social History  Substance Use Topics  . Smoking status: Current Every Day Smoker    Packs/day:  0.50    Years: 10.00    Types: Cigarettes  . Smokeless tobacco: Never Used  . Alcohol use 3.6 oz/week    6 Cans of beer per week     Comment: daily    Review of Systems  Constitutional: No fever/chills Eyes: No visual changes. ENT: No sore throat. Cardiovascular:  chest pain. Respiratory: cough   Gastrointestinal: No abdominal pain.  No nausea, no vomiting.  No diarrhea.  No constipation. Genitourinary: Negative for dysuria. Musculoskeletal:  back pain. Skin: Negative for rash. Neurological: Negative for headaches, focal weakness or  numbness.   ____________________________________________   PHYSICAL EXAM:  VITAL SIGNS: ED Triage Vitals  Enc Vitals Group     BP 11/11/16 0024 130/82     Pulse Rate 11/11/16 0024 89     Resp 11/11/16 0024 20     Temp 11/11/16 0024 99.3 F (37.4 C)     Temp Source 11/11/16 0024 Oral     SpO2 11/11/16 0024 100 %     Weight 11/11/16 0024 153 lb (69.4 kg)     Height 11/11/16 0024 5\' 4"  (1.626 m)     Head Circumference --      Peak Flow --      Pain Score 11/11/16 0023 6     Pain Loc --      Pain Edu? --      Excl. in GC? --     Constitutional: Alert and oriented. Well appearing and in mild distress. Eyes: Conjunctivae are normal. PERRL. EOMI. Head: Atraumatic. Nose: No congestion/rhinnorhea. Mouth/Throat: Mucous membranes are moist.  Oropharynx non-erythematous. Cardiovascular: Normal rate, regular rhythm. Grossly normal heart sounds.  Good peripheral circulation. Respiratory: Normal respiratory effort.  No retractions. Mild crackles at left lung base Gastrointestinal: Soft and nontender. No distention. Positive bowel sounds Musculoskeletal: No lower extremity tenderness nor edema.   Neurologic:  Normal speech and language.  Skin:  Skin is warm, dry and intact.  Psychiatric: Mood and affect are normal.   ____________________________________________   LABS (all labs ordered are listed, but only abnormal results are displayed)  Labs Reviewed  BASIC METABOLIC PANEL - Abnormal; Notable for the following:       Result Value   Glucose, Bld 103 (*)    All other components within normal limits  TROPONIN I  CBC WITH DIFFERENTIAL/PLATELET   ____________________________________________  EKG  ED ECG REPORT I, Rebecka Apley, the attending physician, personally viewed and interpreted this ECG.   Date: 11/11/2016  EKG Time: 0035  Rate: 66  Rhythm: normal sinus rhythm  Axis: normal  Intervals:none  ST&T Change:  none  ____________________________________________  RADIOLOGY  Dg Chest 2 View  Result Date: 11/10/2016 CLINICAL DATA:  Shortness of breath.  Cough. EXAM: CHEST  2 VIEW COMPARISON:  06/24/2016 FINDINGS: Midline trachea. Normal heart size and mediastinal contours. No pleural effusion or pneumothorax. Moderate pulmonary interstitial thickening is again identified. Suspect patchy left perihilar and lower lobe airspace disease, only readily apparent on the frontal radiograph. IMPRESSION: Suspect subtle left-sided airspace disease/ pneumonia. This is superimposed upon moderate central airway thickening, likely related to chronic bronchitis/smoking. Electronically Signed   By: Jeronimo Greaves M.D.   On: 11/10/2016 14:41    ____________________________________________   PROCEDURES  Procedure(s) performed: None  Procedures  Critical Care performed: No  ____________________________________________   INITIAL IMPRESSION / ASSESSMENT AND PLAN / ED COURSE  Pertinent labs & imaging results that were available during my care of the patient were reviewed by me  and considered in my medical decision making (see chart for details).  This is a 31 year old male who comes into the hospital today with some left chest pain and left back pain. The patient was seen earlier today with the same symptoms. The patient did not fill his prescriptions nor did he take any of his antibiotics. I went to the patient that his symptoms would not improve until he takes medication to help with his pneumonia. I explained to the patient that he was prescribed azithromycin which is on the $4 list and he will be able to afford it without insurance. I also encouraged him take Tylenol or ibuprofen for the symptoms and the pain that he may be having. The patient was sleeping though comfortably when I arrived in the room and his oxygen saturation was unremarkable. He did not appear to be in any severe distress. The patient did have some  blood work evaluated and it was all unremarkable. The patient will be discharged home to follow-up.      ____________________________________________   FINAL CLINICAL IMPRESSION(S) / ED DIAGNOSES  Final diagnoses:  Chest pain, unspecified type  Community acquired pneumonia of left lung, unspecified part of lung      NEW MEDICATIONS STARTED DURING THIS VISIT:  Discharge Medication List as of 11/11/2016  5:16 AM       Note:  This document was prepared using Dragon voice recognition software and may include unintentional dictation errors.    Rebecka ApleyWebster, Conley Pawling P, MD 11/11/16 [phone]

## 2016-11-11 NOTE — Discharge Instructions (Signed)
Please take your medications which were prescribed to treat her pneumonia and this chest pain.

## 2016-12-11 ENCOUNTER — Emergency Department
Admission: EM | Admit: 2016-12-11 | Discharge: 2016-12-11 | Disposition: A | Discharge status: Home / Self Care | Payer: BLUE CROSS/BLUE SHIELD | Attending: Emergency Medicine | Admitting: Emergency Medicine

## 2016-12-11 DIAGNOSIS — R51 Headache: Secondary | ICD-10-CM | POA: Insufficient documentation

## 2016-12-11 DIAGNOSIS — Z79899 Other long term (current) drug therapy: Secondary | ICD-10-CM | POA: Insufficient documentation

## 2016-12-11 DIAGNOSIS — B349 Viral infection, unspecified: Secondary | ICD-10-CM | POA: Insufficient documentation

## 2016-12-11 DIAGNOSIS — F17210 Nicotine dependence, cigarettes, uncomplicated: Secondary | ICD-10-CM | POA: Insufficient documentation

## 2016-12-11 DIAGNOSIS — J45909 Unspecified asthma, uncomplicated: Secondary | ICD-10-CM | POA: Insufficient documentation

## 2016-12-11 DIAGNOSIS — K0889 Other specified disorders of teeth and supporting structures: Secondary | ICD-10-CM | POA: Insufficient documentation

## 2016-12-11 MED ORDER — ONDANSETRON HCL 8 MG PO TABS: 8.0000 mg | ORAL_TABLET | Freq: Three times a day (TID) | ORAL | 0 refills | Status: DC | PRN

## 2016-12-11 MED ORDER — IBUPROFEN 600 MG PO TABS: 600.0000 mg | ORAL_TABLET | Freq: Four times a day (QID) | ORAL | 0 refills | Status: DC | PRN

## 2016-12-11 NOTE — ED Triage Notes (Addendum)
Pt c/o HA for the past 3 days with nausea, with c/o toothache.

## 2016-12-11 NOTE — Discharge Instructions (Signed)
[facility]. Advised to establish care with PCP.

## 2016-12-11 NOTE — ED Provider Notes (Signed)
[facility] Emergency Department Provider Note   ____________________________________________   First MD Initiated Contact with Patient 12/11/16 1113     (approximate)  I have reviewed the triage vital signs and the nursing notes.   HISTORY  Chief Complaint Headache and Dental Pain    HPI Roger Hall is a 31 y.o. male patient complained of headache and nausea for 3 days. Patient also states dental pain left lower molar. Patient rates his pain discomfort as a 7/10. Patient describes pain as "achy". No palliative measures for complaint.   Past Medical History:  Diagnosis Date  . Asthma   . Depression     Patient Active Problem List   Diagnosis Date Noted  . Tobacco use disorder 07/29/2016  . PTSD (post-traumatic stress disorder) 07/29/2016  . Alcohol use disorder, moderate, dependence (HCC) 07/29/2016  . Bipolar I disorder, most recent episode depressed, severe without psychotic features (HCC) 07/29/2016    History reviewed. No pertinent surgical history.  Prior to Admission medications   Medication Sig Start Date End Date Taking? Authorizing Provider  albuterol (PROVENTIL HFA;VENTOLIN HFA) 108 (90 Base) MCG/ACT inhaler Inhale 2 puffs into the lungs every 6 (six) hours as needed for wheezing or shortness of breath. 11/10/16   Enid DerryWagner, Ashley, PA-C  ARIPiprazole (ABILIFY) 5 MG tablet Take 1 tablet (5 mg total) by mouth daily. 07/31/16   Pucilowska, Jolanta B, MD  azithromycin (ZITHROMAX Z-PAK) 250 MG tablet Take 2 tablets (500 mg) on  Day 1,  followed by 1 tablet (250 mg) once daily on Days 2 through 5. 11/10/16   Enid DerryWagner, Ashley, PA-C  HYDROcodone-acetaminophen (NORCO/VICODIN) 5-325 MG tablet Take 1 tablet by mouth every 6 (six) hours as needed for moderate pain. 08/25/16   Enid DerryWagner, Ashley, PA-C  ibuprofen (ADVIL,MOTRIN) 600 MG tablet Take 1 tablet (600 mg total) by mouth every 6 (six) hours as needed. 12/11/16   Joni ReiningSmith, Ronald K, PA-C  ondansetron  (ZOFRAN) 8 MG tablet Take 1 tablet (8 mg total) by mouth every 8 (eight) hours as needed for nausea or vomiting. 12/11/16   Joni ReiningSmith, Ronald K, PA-C  Oxcarbazepine (TRILEPTAL) 300 MG tablet Take 1 tablet (300 mg total) by mouth 2 (two) times daily. 07/31/16   Pucilowska, Braulio ConteJolanta B, MD  prazosin (MINIPRESS) 1 MG capsule Take 1 capsule (1 mg total) by mouth 2 (two) times daily. 07/31/16   Pucilowska, Ellin GoodieJolanta B, MD  predniSONE (DELTASONE) 10 MG tablet Take 6 tablets on day 1, take 5 tablets on day 2, take 4 tablets on day 3, take 3 tablets on day 4, take 2 tablets on day 5, take 1 tablet on day 6 11/10/16   Enid DerryWagner, Ashley, PA-C  QUEtiapine (SEROQUEL) 50 MG tablet Take 1 tablet (50 mg total) by mouth at bedtime. 08/01/16   Pucilowska, Braulio ConteJolanta B, MD  traZODone (DESYREL) 100 MG tablet Take 1 tablet (100 mg total) by mouth at bedtime. 07/31/16   Pucilowska, Ellin GoodieJolanta B, MD    Allergies Patient has no known allergies.  Family History  Problem Relation Age of Onset  . Depression Mother     Social History Social History  Substance Use Topics  . Smoking status: Current Every Day Smoker    Packs/day: 0.50    Years: 10.00    Types: Cigarettes  . Smokeless tobacco: Never Used  . Alcohol use 3.6 oz/week    6 Cans of beer per week     Comment: daily    Review of Systems  Constitutional: No fever/chills  Eyes: No visual changes. ENT: No sore throat. Cardiovascular: Denies chest pain. Respiratory: Denies shortness of breath. Gastrointestinal: No abdominal pain.  No nausea, no vomiting.  No diarrhea.  No constipation. Genitourinary: Negative for dysuria. Musculoskeletal: Negative for back pain. Skin: Negative for rash. Neurological: Negative for headaches, focal weakness or numbness. Psychiatric:Anxiety, depression, bipolar, PTSD and alcohol dependency.  ____________________________________________   PHYSICAL EXAM:  VITAL SIGNS: ED Triage Vitals  Enc Vitals Group     BP 12/11/16 1111 121/86      Pulse Rate 12/11/16 1111 68     Resp 12/11/16 1111 14     Temp 12/11/16 1111 98.4 F (36.9 C)     Temp Source 12/11/16 1111 Oral     SpO2 12/11/16 1111 98 %     Weight 12/11/16 1106 153 lb (69.4 kg)     Height 12/11/16 1106 5\' 4"  (1.626 m)     Head Circumference --      Peak Flow --      Pain Score 12/11/16 1105 7     Pain Loc --      Pain Edu? --      Excl. in GC? --     Constitutional: Alert and oriented. Well appearing and in no acute distress. Eyes: Conjunctivae are normal. PERRL. EOMI. Head: Atraumatic. Nose: No congestion/rhinnorhea. Mouth/Throat: Mucous membranes are moist.  Oropharynx non-erythematous. No obvious cavities or abscess. Cardiovascular: Normal rate, regular rhythm. Grossly normal heart sounds.  Good peripheral circulation. Respiratory: Normal respiratory effort.  No retractions. Lungs CTAB. Neurologic:  Normal speech and language. No gross focal neurologic deficits are appreciated. No gait instability. Skin:  Skin is warm, dry and intact. No rash noted. Psychiatric: Mood and affect are normal. Speech and behavior are normal.  ____________________________________________   LABS (all labs ordered are listed, but only abnormal results are displayed)  Labs Reviewed - No data to display ____________________________________________  EKG   ____________________________________________  RADIOLOGY  No results found.  ____________________________________________   PROCEDURES  Procedure(s) performed: None  Procedures  Critical Care performed: No  ____________________________________________   INITIAL IMPRESSION / ASSESSMENT AND PLAN / ED COURSE  Pertinent labs & imaging results that were available during my care of the patient were reviewed by me and considered in my medical decision making (see chart for details).  Dental pain and viral illness. Patient was discharged care instructions. Patient advised to follow-up at the walk[facility] dental clinic  today. Patient also by 6 status [facility] Patient given a work note for today.      ____________________________________________   FINAL CLINICAL IMPRESSION(S) / ED DIAGNOSES  Final diagnoses:  Pain, dental  Viral illness      NEW MEDICATIONS STARTED DURING THIS VISIT:  Discharge Medication List as of 12/11/2016 11:26 AM    START taking these medications   Details  ibuprofen (ADVIL,MOTRIN) 600 MG tablet Take 1 tablet (600 mg total) by mouth every 6 (six) hours as needed., Starting Wed 12/11/2016, Print    ondansetron (ZOFRAN) 8 MG tablet Take 1 tablet (8 mg total) by mouth every 8 (eight) hours as needed for nausea or vomiting., Starting Wed 12/11/2016, Print         Note:  This document was prepared using Dragon voice recognition software and may include unintentional dictation errors.    Joni Reining, PA-C 12/11/16 1130    Emily Filbert, MD 12/11/16 1344

## 2017-01-10 ENCOUNTER — Emergency Department
Admission: EM | Admit: 2017-01-10 | Discharge: 2017-01-11 | Disposition: A | Discharge status: Other Acute Inpatient Hospital | Payer: BLUE CROSS/BLUE SHIELD | Attending: Emergency Medicine | Admitting: Emergency Medicine

## 2017-01-10 ENCOUNTER — Encounter: Payer: Self-pay | Admitting: Emergency Medicine

## 2017-01-10 DIAGNOSIS — F32A Depression, unspecified: Secondary | ICD-10-CM

## 2017-01-10 DIAGNOSIS — Z79899 Other long term (current) drug therapy: Secondary | ICD-10-CM | POA: Insufficient documentation

## 2017-01-10 DIAGNOSIS — F4310 Post-traumatic stress disorder, unspecified: Secondary | ICD-10-CM | POA: Diagnosis present

## 2017-01-10 DIAGNOSIS — F4312 Post-traumatic stress disorder, chronic: Secondary | ICD-10-CM | POA: Insufficient documentation

## 2017-01-10 DIAGNOSIS — R45851 Suicidal ideations: Secondary | ICD-10-CM

## 2017-01-10 DIAGNOSIS — J45909 Unspecified asthma, uncomplicated: Secondary | ICD-10-CM | POA: Insufficient documentation

## 2017-01-10 DIAGNOSIS — F314 Bipolar disorder, current episode depressed, severe, without psychotic features: Secondary | ICD-10-CM | POA: Diagnosis present

## 2017-01-10 DIAGNOSIS — F17210 Nicotine dependence, cigarettes, uncomplicated: Secondary | ICD-10-CM | POA: Insufficient documentation

## 2017-01-10 DIAGNOSIS — F1020 Alcohol dependence, uncomplicated: Secondary | ICD-10-CM | POA: Diagnosis present

## 2017-01-10 DIAGNOSIS — F17200 Nicotine dependence, unspecified, uncomplicated: Secondary | ICD-10-CM | POA: Diagnosis present

## 2017-01-10 DIAGNOSIS — F329 Major depressive disorder, single episode, unspecified: Secondary | ICD-10-CM

## 2017-01-10 LAB — CBC
HEMATOCRIT: 47.8 % (ref 40.0–52.0)
HEMOGLOBIN: 16.2 g/dL (ref 13.0–18.0)
MCH: 29.8 pg (ref 26.0–34.0)
MCHC: 33.9 g/dL (ref 32.0–36.0)
MCV: 87.7 fL (ref 80.0–100.0)
Platelets: 336 10*3/uL (ref 150–440)
RBC: 5.46 MIL/uL (ref 4.40–5.90)
RDW: 13.8 % (ref 11.5–14.5)
WBC: 7.5 10*3/uL (ref 3.8–10.6)

## 2017-01-10 LAB — COMPREHENSIVE METABOLIC PANEL
ALBUMIN: 4.5 g/dL (ref 3.5–5.0)
ALK PHOS: 55 U/L (ref 38–126)
ALT: 21 U/L (ref 17–63)
AST: 35 U/L (ref 15–41)
Anion gap: 12 (ref 5–15)
BILIRUBIN TOTAL: 0.7 mg/dL (ref 0.3–1.2)
BUN: 6 mg/dL (ref 6–20)
CALCIUM: 9.6 mg/dL (ref 8.9–10.3)
CO2: 24 mmol/L (ref 22–32)
Chloride: 104 mmol/L (ref 101–111)
Creatinine, Ser: 0.79 mg/dL (ref 0.61–1.24)
GFR calc Af Amer: >60 mL/min (ref >60)
GFR calc non Af Amer: >60 mL/min (ref >60)
GLUCOSE: 113 mg/dL — AB (ref 65–99)
Potassium: 3.7 mmol/L (ref 3.5–5.1)
SODIUM: 140 mmol/L (ref 135–145)
TOTAL PROTEIN: 7.8 g/dL (ref 6.5–8.1)

## 2017-01-10 LAB — SALICYLATE LEVEL: Salicylate Lvl: <7 mg/dL (ref 2.8–30.0)

## 2017-01-10 LAB — ACETAMINOPHEN LEVEL

## 2017-01-10 LAB — ETHANOL: Alcohol, Ethyl (B): 235 mg/dL — ABNORMAL HIGH (ref <5)

## 2017-01-10 MED ORDER — CHLORDIAZEPOXIDE HCL 25 MG PO CAPS
50.0000 mg | ORAL_CAPSULE | Freq: Three times a day (TID) | ORAL | Status: DC
  Administered 2017-01-11 (×3): 50 mg via ORAL
  Filled 2017-01-10 (×3): qty 2

## 2017-01-10 MED ORDER — FLUOXETINE HCL 20 MG PO CAPS
20.0000 mg | ORAL_CAPSULE | Freq: Every day | ORAL | Status: DC
  Administered 2017-01-11: 20 mg via ORAL
  Filled 2017-01-10: qty 1

## 2017-01-10 NOTE — ED Notes (Signed)
SOC called report on patient given.  SOC ready to talk to pt.  Patient is ready in BHU #1.

## 2017-01-10 NOTE — ED Provider Notes (Signed)
[facility] Emergency Department Provider Note  ____________________________________________  Time seen: Approximately 9:41 PM  I have reviewed the triage vital signs and the nursing notes.   HISTORY  Chief Complaint Suicidal   HPI Roger Hall is a 31 y.o. male history of depression and PTSD who presents for suicidal ideation. Patient drove himself to the emergency room and is begging for help. He says he has been very depressed over the course of the last 2 months and has been having active suicidal thoughts with the plan of overdosing on his medications. Patient has had 3 prior suicide attempts with overdoses. He also endorses that he is a daily drinker and drinks about 12 beers a day and has been doing so for 9 years. Has not been sober for more than day. He has been having marital problems, he just lost his job. He says he hasn't eaten or slept in several days due to severe depression. He denies any other medical problems.  Past Medical History:  Diagnosis Date  . Asthma   . Depression     Patient Active Problem List   Diagnosis Date Noted  . Tobacco use disorder 07/29/2016  . PTSD (post-traumatic stress disorder) 07/29/2016  . Alcohol use disorder, moderate, dependence (HCC) 07/29/2016  . Bipolar I disorder, most recent episode depressed, severe without psychotic features (HCC) 07/29/2016    History reviewed. No pertinent surgical history.  Prior to Admission medications   Medication Sig Start Date End Date Taking? Authorizing Provider  albuterol (PROVENTIL HFA;VENTOLIN HFA) 108 (90 Base) MCG/ACT inhaler Inhale 2 puffs into the lungs every 6 (six) hours as needed for wheezing or shortness of breath. 11/10/16   Enid DerryWagner, Ashley, PA-C  ARIPiprazole (ABILIFY) 5 MG tablet Take 1 tablet (5 mg total) by mouth daily. 07/31/16   Pucilowska, Jolanta B, MD  azithromycin (ZITHROMAX Z-PAK) 250 MG tablet Take 2 tablets (500 mg) on  Day 1,  followed by 1 tablet  (250 mg) once daily on Days 2 through 5. 11/10/16   Enid DerryWagner, Ashley, PA-C  HYDROcodone-acetaminophen (NORCO/VICODIN) 5-325 MG tablet Take 1 tablet by mouth every 6 (six) hours as needed for moderate pain. 08/25/16   Enid DerryWagner, Ashley, PA-C  ibuprofen (ADVIL,MOTRIN) 600 MG tablet Take 1 tablet (600 mg total) by mouth every 6 (six) hours as needed. 12/11/16   Joni ReiningSmith, Ronald K, PA-C  ondansetron (ZOFRAN) 8 MG tablet Take 1 tablet (8 mg total) by mouth every 8 (eight) hours as needed for nausea or vomiting. 12/11/16   Joni ReiningSmith, Ronald K, PA-C  Oxcarbazepine (TRILEPTAL) 300 MG tablet Take 1 tablet (300 mg total) by mouth 2 (two) times daily. 07/31/16   Pucilowska, Braulio ConteJolanta B, MD  prazosin (MINIPRESS) 1 MG capsule Take 1 capsule (1 mg total) by mouth 2 (two) times daily. 07/31/16   Pucilowska, Ellin GoodieJolanta B, MD  predniSONE (DELTASONE) 10 MG tablet Take 6 tablets on day 1, take 5 tablets on day 2, take 4 tablets on day 3, take 3 tablets on day 4, take 2 tablets on day 5, take 1 tablet on day 6 11/10/16   Enid DerryWagner, Ashley, PA-C  QUEtiapine (SEROQUEL) 50 MG tablet Take 1 tablet (50 mg total) by mouth at bedtime. 08/01/16   Pucilowska, Braulio ConteJolanta B, MD  traZODone (DESYREL) 100 MG tablet Take 1 tablet (100 mg total) by mouth at bedtime. 07/31/16   Pucilowska, Ellin GoodieJolanta B, MD    Allergies Patient has no known allergies.  Family History  Problem Relation Age of Onset  .  Depression Mother     Social History Social History  Substance Use Topics  . Smoking status: Current Every Day Smoker    Packs/day: 0.50    Years: 10.00    Types: Cigarettes  . Smokeless tobacco: Never Used  . Alcohol use 3.6 oz/week    6 Cans of beer per week     Comment: daily    Review of Systems  Constitutional: Negative for fever. Eyes: Negative for visual changes. ENT: Negative for sore throat. Neck: No neck pain  Cardiovascular: Negative for chest pain. Respiratory: Negative for shortness of breath. Gastrointestinal: Negative for abdominal pain,  vomiting or diarrhea. Genitourinary: Negative for dysuria. Musculoskeletal: Negative for back pain. Skin: Negative for rash. Neurological: Negative for headaches, weakness or numbness. Psych: + depression, SI. No HI  ____________________________________________   PHYSICAL EXAM:  VITAL SIGNS: ED Triage Vitals [01/10/17 2041]  Enc Vitals Group     BP (!) 143/76     Pulse Rate (!) 119     Resp 18     Temp 98.3 F (36.8 C)     Temp Source Oral     SpO2 94 %     Weight 153 lb (69.4 kg)     Height  (1.626 m)     Head Circumference      Peak Flow      Pain Score 0     Pain Loc      Pain Edu?      Excl. in GC?     Constitutional: Alert and oriented, crying. Well appearing and in no apparent distress. HEENT:      Head: Normocephalic and atraumatic.         Eyes: Conjunctivae are normal. Sclera is non-icteric.       Mouth/Throat: Mucous membranes are moist.       Neck: Supple with no signs of meningismus. Cardiovascular: Tachycardic with regular rhythm. No murmurs, gallops, or rubs. 2+ symmetrical distal pulses are present in all extremities. No JVD. Respiratory: Normal respiratory effort. Lungs are clear to auscultation bilaterally. No wheezes, crackles, or rhonchi.  Gastrointestinal: Soft, non tender, and non distended with positive bowel sounds. No rebound or guarding. Genitourinary: No CVA tenderness. Musculoskeletal: Nontender with normal range of motion in all extremities. No edema, cyanosis, or erythema of extremities. Neurologic: Normal speech and language. Face is symmetric. Moving all extremities. No gross focal neurologic deficits are appreciated. Skin: Skin is warm, dry and intact. No rash noted. Psychiatric: Mood and affect are depressed. Speech and behavior are normal. Crying. SI with plan  ____________________________________________   LABS (all labs ordered are listed, but only abnormal results are displayed)  Labs Reviewed  COMPREHENSIVE METABOLIC  PANEL - Abnormal; Notable for the following:       Result Value   Glucose, Bld 113 (*)    All other components within normal limits  ETHANOL - Abnormal; Notable for the following:    Alcohol, Ethyl (B) 235 (*)    All other components within normal limits  ACETAMINOPHEN LEVEL - Abnormal; Notable for the following:    Acetaminophen (Tylenol), Serum <10 (*)    All other components within normal limits  SALICYLATE LEVEL  CBC  URINE DRUG SCREEN, QUALITATIVE ([facility] ONLY)   ____________________________________________  EKG  none  ____________________________________________  RADIOLOGY  none  ____________________________________________   PROCEDURES  Procedure(s) performed: None Procedures Critical Care performed:  None ____________________________________________   INITIAL IMPRESSION / ASSESSMENT AND PLAN / ED COURSE  31 y.o. male history  of depression and PTSD who presents for suicidal ideation with plan and severe depression. IVC papers taken on patient. CIWA initiated. Labs showing EtOH 235, drug screen pending. Will consult psychiatry.     Pertinent labs & imaging results that were available during my care of the patient were reviewed by me and considered in my medical decision making (see chart for details).    ____________________________________________   FINAL CLINICAL IMPRESSION(S) / ED DIAGNOSES  Final diagnoses:  Suicidal ideation  Depression, unspecified depression type      NEW MEDICATIONS STARTED DURING THIS VISIT:  New Prescriptions   No medications on file     Note:  This document was prepared using Dragon voice recognition software and may include unintentional dictation errors.    Don Perking, [place], MD 01/10/17 2325

## 2017-01-10 NOTE — ED Notes (Signed)
Pt. Moved to BHU #1

## 2017-01-10 NOTE — ED Notes (Signed)
[facility] once new set of vitals obtain and pulse with in limits.

## 2017-01-10 NOTE — ED Notes (Signed)
Pt. States he is SI and has plan to overdose on pills.  Pt. Drove here by himself and states he has the pills in his car.  Pt. States he is not seeing a psychiatrist at this time.

## 2017-01-10 NOTE — ED Notes (Signed)
Pt. To BHU from ED ambulatory without difficulty, to room  BHU1. Report from [facility]. Pt. Is alert and oriented, warm and dry in no distress. Pt. Denies SI, HI, and AVH. Pt. Calm and cooperative. Pt tearful stating he has been very depressed and when he feels that way he turns to alcohol. Pt states his mother has sorosis of the liver and he does not want that problem. Pt. Made aware of security cameras and Q15 minute rounds. Pt. Encouraged to let Nursing staff know of any concerns or needs.

## 2017-01-10 NOTE — ED Notes (Signed)

## 2017-01-10 NOTE — ED Triage Notes (Signed)
Pt ambulatory to triage in NAD, report recent depression w/ SI, plan to overdose on pills that were prescribed to him for depression, anxiety and PTSD, states pills are in car.  Pt also reports alcohol abuse, usually drinks 12-pack a day, last drink about 3 hours ago.  Pt also reports insomnia and loss of appetite.

## 2017-01-10 NOTE — ED Notes (Signed)
[facility].

## 2017-01-11 ENCOUNTER — Encounter: Payer: Self-pay | Admitting: Psychiatry

## 2017-01-11 ENCOUNTER — Inpatient Hospital Stay
Admission: AD | Admit: 2017-01-11 | Discharge: 2017-01-16 | DRG: 885 | Disposition: A | Discharge status: Home / Self Care | Payer: No Typology Code available for payment source | Attending: Psychiatry | Admitting: Psychiatry

## 2017-01-11 DIAGNOSIS — F3160 Bipolar disorder, current episode mixed, unspecified: Secondary | ICD-10-CM | POA: Diagnosis present

## 2017-01-11 DIAGNOSIS — F1020 Alcohol dependence, uncomplicated: Secondary | ICD-10-CM | POA: Diagnosis present

## 2017-01-11 DIAGNOSIS — Z590 Homelessness: Secondary | ICD-10-CM | POA: Diagnosis not present

## 2017-01-11 DIAGNOSIS — R45851 Suicidal ideations: Secondary | ICD-10-CM | POA: Diagnosis present

## 2017-01-11 DIAGNOSIS — F314 Bipolar disorder, current episode depressed, severe, without psychotic features: Secondary | ICD-10-CM | POA: Diagnosis not present

## 2017-01-11 DIAGNOSIS — Z79899 Other long term (current) drug therapy: Secondary | ICD-10-CM | POA: Diagnosis not present

## 2017-01-11 DIAGNOSIS — F1590 Other stimulant use, unspecified, uncomplicated: Secondary | ICD-10-CM | POA: Diagnosis present

## 2017-01-11 DIAGNOSIS — Z9119 Patient's noncompliance with other medical treatment and regimen: Secondary | ICD-10-CM | POA: Diagnosis not present

## 2017-01-11 DIAGNOSIS — J45909 Unspecified asthma, uncomplicated: Secondary | ICD-10-CM | POA: Diagnosis present

## 2017-01-11 DIAGNOSIS — F4310 Post-traumatic stress disorder, unspecified: Secondary | ICD-10-CM | POA: Diagnosis present

## 2017-01-11 DIAGNOSIS — F17200 Nicotine dependence, unspecified, uncomplicated: Secondary | ICD-10-CM | POA: Diagnosis present

## 2017-01-11 DIAGNOSIS — Z818 Family history of other mental and behavioral disorders: Secondary | ICD-10-CM

## 2017-01-11 DIAGNOSIS — F17210 Nicotine dependence, cigarettes, uncomplicated: Secondary | ICD-10-CM | POA: Diagnosis present

## 2017-01-11 LAB — URINE DRUG SCREEN, QUALITATIVE (ARMC ONLY)
Amphetamines, Ur Screen: POSITIVE — AB
BARBITURATES, UR SCREEN: NOT DETECTED
Benzodiazepine, Ur Scrn: POSITIVE — AB
CANNABINOID 50 NG, UR ~~LOC~~: NOT DETECTED
Cocaine Metabolite,Ur ~~LOC~~: POSITIVE — AB
MDMA (Ecstasy)Ur Screen: NOT DETECTED
Methadone Scn, Ur: NOT DETECTED
Opiate, Ur Screen: NOT DETECTED
Phencyclidine (PCP) Ur S: NOT DETECTED
TRICYCLIC, UR SCREEN: NOT DETECTED

## 2017-01-11 MED ORDER — QUETIAPINE FUMARATE 100 MG PO TABS
100.0000 mg | ORAL_TABLET | Freq: Every day | ORAL | Status: DC
  Administered 2017-01-11 – 2017-01-12 (×2): 100 mg via ORAL
  Filled 2017-01-11 (×2): qty 1

## 2017-01-11 MED ORDER — ACETAMINOPHEN 325 MG PO TABS: 650.0000 mg | ORAL_TABLET | Freq: Four times a day (QID) | ORAL | Status: DC | PRN

## 2017-01-11 MED ORDER — TRAZODONE HCL 100 MG PO TABS: 100.0000 mg | ORAL_TABLET | Freq: Every day | ORAL | Status: DC

## 2017-01-11 MED ORDER — ALUM & MAG HYDROXIDE-SIMETH 200-200-20 MG/5ML PO SUSP: 30.0000 mL | ORAL | Status: DC | PRN

## 2017-01-11 MED ORDER — HYDROXYZINE HCL 50 MG PO TABS
50.0000 mg | ORAL_TABLET | Freq: Three times a day (TID) | ORAL | Status: DC | PRN
  Administered 2017-01-12: 50 mg via ORAL
  Filled 2017-01-11: qty 1

## 2017-01-11 MED ORDER — CHLORDIAZEPOXIDE HCL 25 MG PO CAPS
25.0000 mg | ORAL_CAPSULE | Freq: Four times a day (QID) | ORAL | Status: DC
  Administered 2017-01-11 – 2017-01-12 (×4): 25 mg via ORAL
  Filled 2017-01-11 (×4): qty 1

## 2017-01-11 MED ORDER — NICOTINE 21 MG/24HR TD PT24
21.0000 mg | MEDICATED_PATCH | Freq: Every day | TRANSDERMAL | Status: DC
  Administered 2017-01-12 – 2017-01-16 (×5): 21 mg via TRANSDERMAL
  Filled 2017-01-11 (×5): qty 1

## 2017-01-11 MED ORDER — ALBUTEROL SULFATE (2.5 MG/3ML) 0.083% IN NEBU: 3.0000 mL | INHALATION_SOLUTION | Freq: Four times a day (QID) | RESPIRATORY_TRACT | Status: DC | PRN

## 2017-01-11 MED ORDER — MAGNESIUM HYDROXIDE 400 MG/5ML PO SUSP: 30.0000 mL | Freq: Every day | ORAL | Status: DC | PRN

## 2017-01-11 NOTE — Tx Team (Signed)
Initial Treatment Plan 01/11/2017 7:05 PM Roger Footmansaac Antonio Campas OZD:[id number]    PATIENT STRESSORS: Substance abuse   PATIENT STRENGTHS: Ability for insight Motivation for treatment/growth   PATIENT IDENTIFIED PROBLEMS:   Major depression    Substance Abuse/Alcohol    Anxiety/Stress    Unemployment/homeless       DISCHARGE CRITERIA:  Improved stabilization in mood, thinking, and/or behavior  PRELIMINARY DISCHARGE PLAN: Attend 12-step recovery group  PATIENT/FAMILY INVOLVEMENT: This treatment plan has been presented to and reviewed with the patient, Roger Footmansaac Antonio Hall.  The patient and family have been given the opportunity to ask questions and make suggestions.  Iantha Falleniffaney L Jaidyn Kuhl, RN 01/11/2017, 7:05 PM

## 2017-01-11 NOTE — ED Notes (Signed)
Patient is awake and oriented. Patient took scheduled medication without difficulty. Continues to deny any symptoms of withdrawal from alcohol. Respirations are even and non labored. No distress noted. Monitoring continues. Fluids offered but refused.

## 2017-01-11 NOTE — ED Notes (Signed)
Patient observed resting in bed. Chest noted to rise and fall. Patient changes position of resting. No distress noted. Monitoring continues.

## 2017-01-11 NOTE — ED Notes (Signed)
Patient alert and oriented. Patient denies pain. Patient states he came in and that he has been having ongoing depression and turns to alcohol to cope. Patient stated he had been on medication in past but has not taken them for a while. He is  unable to state how long he has been off medication. Patient denies SI/HI and A/V/H. Patient denies any symptoms of alcohol withdrawal. Patient in no distress and voices no concerns at this time. Monitoring of patient continues.

## 2017-01-11 NOTE — BH Assessment (Signed)
Clinician attempted to provide Dr.Pucilowska with pt information to review [facility]. Dr. Andreas NewportUnwilling to receive information at this time and states she is reviewing other patients.

## 2017-01-11 NOTE — ED Notes (Signed)
Patient observed resting in bed with eyes closed. Respirations even and non labored. No distress noted. Monitoring continues.

## 2017-01-11 NOTE — ED Provider Notes (Addendum)
-----------------------------------------   4:21 PM on 01/11/2017 -----------------------------------------  [facility]    ----------------------------------------- 7:49 AM on 01/11/2017 -----------------------------------------   Blood pressure 103/66, pulse 73, temperature 97.9 F (36.6 C), temperature source Tympanic, resp. rate 16, height 1.626 m (5\' 4" ), weight 69.4 kg (153 lb), SpO2 100 %.  The patient had no acute events since last update.  Calm and cooperative at this time.  Disposition is pending Psychiatry/Behavioral Medicine team recommendations.     Loleta RoseForbach, Edwen Mclester, MD 01/11/17 [id number]    Loleta RoseForbach, Glorene Leitzke, MD 01/11/17 [phone]

## 2017-01-11 NOTE — BHH Group Notes (Signed)
[facility] Group Notes:  (Nursing/MHT/Case Management/Adjunct)  Date:  01/11/2017  Time:  9:48 PM  Type of Therapy:  Group Therapy  Participation Level:  Did Not Attend  Participation [name] 01/11/2017, 9:48 PM

## 2017-01-11 NOTE — BH Assessment (Signed)
Admission orders entered by Dr.Pucilowska.  Attending Physician will be Dr. Jennet MaduroPucilowska.   Patient has been [facility] to room 319, [facility]iffany.   ER staff is aware of the admission Christen Bame( Ronnie, ER Sect.; Dr. York CeriseForbach, ER MD; Britta MccreedyBarbara Patient's Nurse & Marylene LandAngela Patient Access).  Pt may transfer to BMU at 1730.

## 2017-01-11 NOTE — ED Notes (Signed)
Patient resting in bed with eyes open. Medications administered. Respirations even and non labored.  Patient in no noted distress. Monitoring continues.

## 2017-01-11 NOTE — ED Notes (Signed)
Patient resting in bed with eyes closed. Patient made aware lunch was here and at bedside. Patient acknowledge conversation.  Patient in no distress monitoring continues.

## 2017-01-11 NOTE — Progress Notes (Signed)
[facility] expressing having some depression, SI ideations to overdose on pills. Patient also is on CIWA for alcohol and stated that he has used cocaine to help him feel better. Patient denies any SI/HI/AH/VH currently but states he has had suicidal thoughts prior to admission and that he sometimes feel paranoid when drinking. Patient has insight that he has a problem and needs help with his substance abuse/alcohol problem. Patient stated he has had a loss of appetite and has lost some weight. Patient did eat some of his dinner on the unit. Patient was searched and skin assess with no contraband found. Patient was oriented to unit, room and rules.Patient is alert and oriented x 4, breathing unlabored, and extremities x 4 within normal limits. Patient is calm and cooperative. Patient did not display any disruptive behavior. Patient continues to be monitored on 15 minute safety checks. Will continue to monitor patient and notify MD of any changes.

## 2017-01-11 NOTE — Consult Note (Signed)
  I reviewed the chart. The patient known to [facility]. Please collect UDS.

## 2017-01-11 NOTE — BH Assessment (Addendum)
Assessment Note  Roger Hall is an 31 y.o. male self-referred to the ED and presenting with c/o depression and SI w/ plan to OD. Pt reports access to pills. Pt reports h/o inpatient admission and three suicide attempts. Pt repots cocaine use "sometimes during a month" and daily alcohol consumption ("one case of beer"). Pt reports bipolar d/o and PTSD PMHDx.  Pt reports feelings of worthlessness and guilt. Pt also reports sleep and appetite disturbance. Pt denies HI and psychosis. Pt denies access to weapons and any h/o violence or aggression.  Pt reports multiple ongoing stressors including unemployment and homelessness.   Diagnosis: bipolar d/o, depressed  Past Medical History:  Past Medical History:  Diagnosis Date  . Asthma   . Depression     History reviewed. No pertinent surgical history.  Family History:  Family History  Problem Relation Age of Onset  . Depression Mother     Social History:  reports that he has been smoking Cigarettes.  He has a 5.00 pack-year smoking history. He has never used smokeless tobacco. He reports that he drinks about 3.6 oz of alcohol per week . His drug history is not on file.  Additional Social History:  Alcohol / Drug Use Pain Medications: Pt denies abuse Prescriptions: Pt denies abuse Over the Counter: Pt denies abuse. History of alcohol / drug use?: Yes Negative Consequences of Use:  (Not Reported) Withdrawal Symptoms:  (Not Reported) Substance #1 Name of Substance 1: Cocaine 1 - Age of First Use: Not Reported 1 - Amount (size/oz): Not Reported 1 - Frequency: "Sometimes during a month" 1 - Duration: Ongoing 1 - Last Use / Amount: "a few days ago"/ Not Reported Substance #2 Name of Substance 2: Alcohol 2 - Age of First Use: Not Reported 2 - Amount (size/oz): "one case of beer" 2 - Frequency: daily 2 - Duration: Ongoing 2 - Last Use / Amount: Not Reported  CIWA: CIWA-Ar BP: 126/81 Pulse Rate: 97 Nausea and Vomiting: no nausea  and no vomiting Tactile Disturbances: none Tremor: no tremor Auditory Disturbances: not present Paroxysmal Sweats: no sweat visible Visual Disturbances: not present Anxiety: no anxiety, at ease Headache, Fullness in Head: none present Agitation: normal activity Orientation and Clouding of Sensorium: oriented and can do serial additions CIWA-Ar Total: 0 COWS:    Allergies: No Known Allergies  Home Medications:  (Not in a hospital admission)  OB/GYN Status:  No LMP for male patient.  General Assessment Data [facility] Assessment: In system Is this a Tele or Face-to-Face Assessment?: Face-to-Face Is this an Initial Assessment or a Re-assessment for this encounter?: Initial Assessment Marital status: Single Is patient pregnant?: No Pregnancy Status: No Living Arrangements: Other (Comment) (Homeless) Can pt return to current living arrangement?:  (Pt Homeless) Admission Status: Voluntary Is patient capable of signing voluntary admission?: Yes Referral Source: Self/Family/Friend Insurance type: BCBS     Crisis Care Plan Living Arrangements: Other (Comment) (Homeless) Name of Psychiatrist: None Name of Therapist: None  Education Status Is patient currently in school?: No Highest grade of school patient has completed: 10  Risk to self with the past 6 months Suicidal Ideation: Yes-Currently Present Has patient been a risk to self within the past 6 months prior to admission? : Yes Suicidal Intent: Yes-Currently Present Has patient had any suicidal intent within the past 6 months prior to admission? : Yes Is patient at risk for suicide?: Yes Suicidal Plan?: Yes-Currently Present Has patient had any suicidal plan within the past  6 months prior to admission? : Yes Specify Current Suicidal Plan: OD Access to Means: Yes Specify Access to Suicidal Means: access to pills What has been your use of drugs/alcohol within the last 12 months?: Pt reports h/o  cocaine and daily ETOH use Previous Attempts/Gestures: Yes How many times?: 3 Other Self Harm Risks: None reported Triggers for Past Attempts: Unknown Intentional Self Injurious Behavior: None Family Suicide History: No Recent stressful life event(s): Financial Problems, Other (Comment) (unemployment, homeless) Persecutory voices/beliefs?: No Depression: Yes Depression Symptoms: Insomnia, Feeling worthless/self pity, Guilt Substance abuse history and/or treatment for substance abuse?: Yes Suicide prevention information given to non-admitted patients: Not applicable  Risk to Others within the past 6 months Homicidal Ideation: No Does patient have any lifetime risk of violence toward others beyond the six months prior to admission? : No Thoughts of Harm to Others: No Current Homicidal Intent: No Current Homicidal Plan: No Access to Homicidal Means: No History of harm to others?: No Assessment of Violence: None Noted Does patient have access to weapons?: No Criminal Charges Pending?: No Does patient have a court date: No Is patient on probation?: No  Psychosis Hallucinations: None noted Delusions: None noted  Mental Status Report Appearance/Hygiene: In scrubs Eye Contact: Poor Motor Activity: Freedom of movement, Unremarkable Speech: Logical/coherent, Soft Level of Consciousness: Quiet/awake Mood: Depressed Affect: Depressed Anxiety Level: None Thought Processes: Coherent, Relevant Judgement: Unimpaired Orientation: Person, Place, Time, Situation Obsessive Compulsive Thoughts/Behaviors: None  Cognitive Functioning Concentration: Normal Memory: Recent Intact, Remote Intact IQ: Average Insight: Fair Impulse Control: Fair Appetite: Fair Weight Loss:  (unkwn.) Weight Gain:  (unkwn.) Sleep: Decreased Vegetative Symptoms: None  ADLScreening [facility] Assessment Services) Patient's cognitive ability adequate to safely complete daily activities?: Yes Patient able to  express need for assistance with ADLs?: Yes Independently performs ADLs?: Yes (appropriate for developmental age)  Prior Inpatient Therapy Prior Inpatient Therapy: Yes Prior Therapy Dates: Multiple, last admitted 3/18 Prior Therapy Facilty/Provider(s): [facility] Reason for Treatment: bipolar d/o, SI  Prior Outpatient Therapy Prior Outpatient Therapy: No Does patient have an ACCT team?: No Does patient have Intensive In-House Services?  : No Does patient have [facility] services? : No Does patient have P4CC services?: No  ADL Screening (condition at time of admission) Patient's cognitive ability adequate to safely complete daily activities?: Yes Patient able to express need for assistance with ADLs?: Yes Independently performs ADLs?: Yes (appropriate for developmental age)       Abuse/Neglect Assessment (Assessment to be complete while patient is alone) Physical Abuse: Denies Verbal Abuse: Denies Sexual Abuse: Denies Exploitation of patient/patient's resources: Denies Self-Neglect: Denies Values / Beliefs Spiritual Requests During Hospitalization: None Consults Spiritual Care Consult Needed: No Social Work Consult Needed: No Merchant navy officer (For Healthcare) Does Patient Have a Medical Advance Directive?: No Would patient like information on creating a medical advance directive?: No - Patient declined    Additional Information 1:1 In Past 12 Months?: No CIRT Risk: No Elopement Risk: No Does patient have medical clearance?: Yes     Disposition:  Disposition Initial Assessment Completed for this Encounter: Yes Disposition of Patient: Inpatient treatment program Type of inpatient treatment program: Adult ([facility])  On Site Evaluation by:   Reviewed with Physician:    Jaleena Viviani J [place] 01/11/2017 9:56 AM

## 2017-01-12 DIAGNOSIS — F314 Bipolar disorder, current episode depressed, severe, without psychotic features: Principal | ICD-10-CM

## 2017-01-12 MED ORDER — ENSURE ENLIVE PO LIQD
237.0000 mL | Freq: Two times a day (BID) | ORAL | Status: DC
  Administered 2017-01-13 – 2017-01-15 (×6): 237 mL via ORAL

## 2017-01-12 NOTE — Progress Notes (Signed)
NUTRITION ASSESSMENT  Pt identified as at risk on the Malnutrition Screen Tool  INTERVENTION: 1. Educated patient on the importance of nutrition and encouraged intake of food and beverages. 2. Discussed weight goals. 3. Supplements: Ensure Enlive po BID, each supplement provides 350 kcal and 20 grams of protein. Patient prefers vanilla flavor.  NUTRITION DIAGNOSIS: Unintentional weight loss related to sub-optimal intake as evidenced by pt report.   Goal: Pt to meet >/= 90% of their estimated nutrition needs.  Monitor:  PO intake  Assessment:  31 y.o. male with PMHx of asthma, depression, bipolar disorder, substance abuse who was admitted in setting of worsening depression and SI. Per H&P patient is recently homeless.  Patient reports he has had a poor appetite for a few days now. He reports he just is not feeling very hungry. Denies any N/V, abdominal pain, difficulty chewing/swallowing. Unable to provide specifics on decreased intake PTA. He reports he is eating "okay" here. Noted he had 0% of breakfast this morning and 90% of lunch this afternoon per chart.  Patient reports his UBW is 153 lbs. He has lost 6.5 lbs (4.2% body weight) over unknown time period.  Patient is amenable to drink Ensure between meals to prevent further unintentional weight loss. He prefers the vanilla flavor. Also encouraged adequate intake of calories and protein from meals and snacks.   Height: Ht Readings from Last 1 Encounters:  01/11/17 5\' 4"  (1.626 m)    Weight: Wt Readings from Last 1 Encounters:  01/11/17 146 lb 8 oz (66.5 kg)    Weight Hx: Wt Readings from Last 10 Encounters:  01/11/17 146 lb 8 oz (66.5 kg)  01/10/17 153 lb (69.4 kg)  12/11/16 153 lb (69.4 kg)  11/11/16 153 lb (69.4 kg)  11/10/16 153 lb (69.4 kg)  08/25/16 153 lb (69.4 kg)  07/29/16 154 lb (69.9 kg)  07/28/16 153 lb (69.4 kg)  06/24/16 153 lb (69.4 kg)  05/16/16 145 lb (65.8 kg)    BMI:  Body mass index is 25.15  kg/m. Pt meets criteria for overweight based on current BMI.  Estimated Nutritional Needs: Kcal: 25-30 kcal/kg Protein: > 1 gram protein/kg Fluid: 1 ml/kcal  Diet Order: Diet regular Room service appropriate? Yes; Fluid consistency: Thin Pt is also offered choice of unit snacks mid-morning and mid-afternoon.  Pt is eating as desired.   Lab results and medications reviewed.   Helane RimaLeanne Cloee Dunwoody, MS, RD, LDN Pager: [phone] After Hours Pager: [phone]

## 2017-01-12 NOTE — BHH Suicide Risk Assessment (Signed)
B[facility] INPATIENT:  Family/Significant Other Suicide Prevention Education  Suicide Prevention Education:  Patient Refusal for Family/Significant Other Suicide Prevention Education: The patient Roger Hall has refused to provide written consent for family/significant other to be provided Family/Significant Other Suicide Prevention Education during admission and/or prior to discharge.  Physician notified.  Cleda DaubSara P Jezreel Justiniano 01/12/2017, 5:24 PM

## 2017-01-12 NOTE — Progress Notes (Signed)
Patient ID: Roger Hall, male   DOB: 08/28/1985, 31 y.o.   MRN: [id number] LCSW Group Therapy Note  01/12/2017 1pm  Type of Therapy and Topic:  Group Therapy:  Cognitive Distortions  Participation Level:  Did Not Attend   Description of Group:    Patients in this group will be introduced to the topic of cognitive distortions.  Patients will identify and describe cognitive distortions, describe the feelings these distortions create for them.  Patients will identify one or more situations in their personal life where they have cognitively distorted thinking and will verbalize challenging this cognitive distortion through positive thinking skills.  Patients will practice the skill of using positive affirmations to challenge cognitive distortions using affirmation cards.    Therapeutic Goals:  1. Patient will identify two or more cognitive distortions they have used 2. Patient will identify one or more emotions that stem from use of a cognitive distortion 3. Patient will demonstrate use of a positive affirmation to counter a cognitive distortion through discussion and/or role play. 4. Patient will describe one way cognitive distortions can be detrimental to wellness   Summary of Patient Progress:     Therapeutic Modalities:   Cognitive Behavioral Therapy Motivational Interviewing   Roger MacSara P Albaro Deviney, LCSW 01/12/2017 4:57 PM
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
## 2017-01-12 NOTE — Plan of Care (Signed)
Problem: Coping: Goal: Ability to verbalize frustrations and anger appropriately will improve Outcome: Progressing Patient was given belongings and he was allowed to get phone numbers from phone before it was placed in the locker.  He was compliant and cooperative this evening.  He stayed in his room but came out for group and snack.  He showered and put on street clothes.  He denies symptoms of withdrawal but states "I just feel bad".  Patient is without tremors and denies pain or nausea.    Problem: Safety: Goal: Periods of time without injury will increase Outcome: Progressing Patient is asking appropriate questions about the unit routine.  He is checked every 15 minutes and safety is maintained.

## 2017-01-12 NOTE — Progress Notes (Signed)
Patient ID: Roger Hall, male   DOB: [birth year], 29 y.o.   MRN: [id number] [facility].  Jake SharkSara Sihaam Chrobak, LCSW

## 2017-01-12 NOTE — Progress Notes (Signed)
Pt seems to be in a depressed mood. He is isolative to room resting in bed. Pt commits to safety on unit.Pt compliant with medications. Complained medications may be making him drowsy. Will continue to monitor for safety.

## 2017-01-12 NOTE — H&P (Signed)
Psychiatric Admission Assessment Adult  Patient Identification: Roger Hall MRN:  [id number] Date of Evaluation:  01/12/2017 Chief Complaint:  Bipolar I disorder, most recent episode depressed, severe without psychotic features Principal Diagnosis: Bipolar I disorder, most recent episode depressed, severe without psychotic features ([place]) Diagnosis:   Patient Active Problem List   Diagnosis Date Noted  . 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 Tobacco use disorder [F17.200] 07/29/2016  . PTSD (post-traumatic stress disorder) [F43.10] 07/29/2016  . Alcohol use disorder, moderate, dependence ([place]) [F10.20] 07/29/2016  . Bipolar I disorder, most recent episode depressed, severe without psychotic features ([place]) [F31.4] 07/29/2016   History of Present Illness:   Identifying data. Mr. Dencil Pettifordis a 31 year old malewith a history of bipolar disorder and substance abuse.  Chief complaint. "Depression."  History of present illness. Information was obtained from the patient and the chart. The patient came to the emergency room complaining of worsening of depression and suicidal ideation. He was hospitalized at [facility] 2 months ago and discharged to [facility] but he never arrived there. Instead he stayed with his family and kept a job until 2 days ago when he had falling out with his family. He became homeless and came to the emergency room. The patient did not continue on his medications following discharge. He immediately relapsed on alcohol and has been drinking "a few beers" every day. In addition he's been using stimulants. He is positive for cocaine and amphetamines. He reports all symptoms of depression with poor sleep, decreased appetite, anhedonia, guilt and hopelessness worthlessness, poor energy and concentration, social isolation, crying spells. This culminated in suicidal thinking.. It is worth noting that in the emergency room the patient  oriented and denies suicidal ideation. He was evaluated by an elevated psychiatrist at admission was recommended. The patient denies psychotic symptoms or symptoms suggestive of bipolar mania. He endorses high anxiety. He was diagnosed with PTSD before.  Past psychiatric history. The patient has 5 prior psychiatric hospitalizations. He's been tried on numerous medications including lithium and Depakote. Lithium caused tremors and Depakote weight gain and they are both unacceptable. There were several suicide attempts including hanging. 3 years ago he was in substance use treatment program. He relapsed on substances almost immediately.   Family psychiatric history. Mother with bipolar disorder.  Social history. He is homeless, jobless and uninsured.   Total Time spent with patient: 1 hour  Is the patient at risk to self? Yes.    Has the patient been a risk to self in the past 6 months? Yes.    Has the patient been a risk to self within the distant past? Yes.    Is the patient a risk to others? No.  Has the patient been a risk to others in the past 6 months? No.  Has the patient been a risk to others within the distant past? No.   Prior Inpatient Therapy:   Prior Outpatient Therapy:    Alcohol Screening: 1. How often do you have a drink containing alcohol?: 4 or more times a week 2. How many drinks containing alcohol do you have on a typical day when you are drinking?: 5 or 6 3. How often do you have six or more drinks on one occasion?: Weekly Preliminary Score: 5 4. How often during the last year have you found that you were not able to stop drinking once you had started?: Less than monthly 5. How often during the last year have you failed to do  what was normally expected from you becasue of drinking?: Monthly 6. How often during the last year have you needed a first drink in the morning to get yourself going after a heavy drinking session?: Never 7. How often during the last year have  you had a feeling of guilt of remorse after drinking?: Less than monthly 8. How often during the last year have you been unable to remember what happened the night before because you had been drinking?: Monthly 9. Have you or someone else been injured as a result of your drinking?: No 10. Has a relative or friend or a doctor or another health worker been concerned about your drinking or suggested you cut down?: No Alcohol Use Disorder Identification Test Final Score (AUDIT): 15 Brief Intervention: Yes Substance Abuse History in the last 12 months:  Yes.   Consequences of Substance Abuse: Negative Previous Psychotropic Medications: Yes  Psychological Evaluations: No  Past Medical History:  Past Medical History:  Diagnosis Date  . Asthma   . Depression    History reviewed. No pertinent surgical history. Family History:  Family History  Problem Relation Age of Onset  . Depression Mother    Tobacco Screening:   Social History:  History  Alcohol Use  . 3.6 oz/week  . 6 Cans of beer per week    Comment: daily     History  Drug use: Unknown    Additional Social History:                           Allergies:  No Known Allergies Lab Results:  Results for orders placed or performed during the hospital encounter of 01/10/17 (from the past 48 hour(s))  Urine Drug Screen, Qualitative     Status: Abnormal   Collection Time: 01/10/17  2:45 PM  Result Value Ref Range   Tricyclic, Ur Screen NONE DETECTED NONE DETECTED   Amphetamines, Ur Screen POSITIVE (A) NONE DETECTED   MDMA (Ecstasy)Ur Screen NONE DETECTED NONE DETECTED   Cocaine Metabolite,Ur [place] POSITIVE (A) NONE DETECTED   Opiate, Ur Screen NONE DETECTED NONE DETECTED   Phencyclidine (PCP) Ur S NONE DETECTED NONE DETECTED   Cannabinoid 50 Ng, Ur [place] NONE DETECTED NONE DETECTED   Barbiturates, Ur Screen NONE DETECTED NONE DETECTED   Benzodiazepine, Ur Scrn POSITIVE (A) NONE DETECTED   Methadone Scn, Ur NONE DETECTED  NONE DETECTED    Comment: (NOTE) 342  Tricyclics, urine               Cutoff 1000 ng/mL 200  Amphetamines, urine             Cutoff 1000 ng/mL 300  MDMA (Ecstasy), urine           Cutoff 500 ng/mL 400  Cocaine Metabolite, urine       Cutoff 300 ng/mL 500  Opiate, urine                   Cutoff 300 ng/mL 600  Phencyclidine (PCP), urine      Cutoff 25 ng/mL 700  Cannabinoid, urine              Cutoff 50 ng/mL 800  Barbiturates, urine             Cutoff 200 ng/mL 900  Benzodiazepine, urine           Cutoff 200 ng/mL 1000 Methadone, urine  Cutoff 300 ng/mL 1100 1200 The urine drug screen provides only a preliminary, unconfirmed 1300 analytical test result and should not be used for non-medical 1400 purposes. Clinical consideration and professional judgment should 1500 be applied to any positive drug screen result due to possible 1600 interfering substances. A more specific alternate chemical method 1700 must be used in order to obtain a confirmed analytical result.  1800 Gas chromato graphy / mass spectrometry (GC/MS) is the preferred 1900 confirmatory method.   Comprehensive metabolic panel     Status: Abnormal   Collection Time: 01/10/17  8:40 PM  Result Value Ref Range   Sodium 140 135 - 145 mmol/L   Potassium 3.7 3.5 - 5.1 mmol/L   Chloride 104 101 - 111 mmol/L   CO2 24 22 - 32 mmol/L   Glucose, Bld 113 (H) 65 - 99 mg/dL   BUN 6 6 - 20 mg/dL   Creatinine, Ser 0.79 0.61 - 1.24 mg/dL   Calcium 9.6 8.9 - 10.3 mg/dL   Total Protein 7.8 6.5 - 8.1 g/dL   Albumin 4.5 3.5 - 5.0 g/dL   AST 35 15 - 41 U/L   ALT 21 17 - 63 U/L   Alkaline Phosphatase 55 38 - 126 U/L   Total Bilirubin 0.7 0.3 - 1.2 mg/dL   GFR calc non Af Amer >60 >60 mL/min   GFR calc Af Amer >60 >60 mL/min    Comment: (NOTE) The eGFR has been calculated using the CKD EPI equation. This calculation has not been validated in all clinical situations. eGFR's persistently <60 mL/min signify possible Chronic  Kidney Disease.    Anion gap 12 5 - 15  Ethanol     Status: Abnormal   Collection Time: 01/10/17  8:40 PM  Result Value Ref Range   Alcohol, Ethyl (B) 235 (H) <5 mg/dL    Comment:        LOWEST DETECTABLE LIMIT FOR SERUM ALCOHOL IS 5 mg/dL FOR MEDICAL PURPOSES ONLY   Salicylate level     Status: None   Collection Time: 01/10/17  8:40 PM  Result Value Ref Range   Salicylate Lvl <9.4 2.8 - 30.0 mg/dL  Acetaminophen level     Status: Abnormal   Collection Time: 01/10/17  8:40 PM  Result Value Ref Range   Acetaminophen (Tylenol), Serum <10 (L) 10 - 30 ug/mL    Comment:        THERAPEUTIC CONCENTRATIONS VARY SIGNIFICANTLY. A RANGE OF 10-30 ug/mL MAY BE AN EFFECTIVE CONCENTRATION FOR MANY PATIENTS. HOWEVER, SOME ARE BEST TREATED AT CONCENTRATIONS OUTSIDE THIS RANGE. ACETAMINOPHEN CONCENTRATIONS >150 ug/mL AT 4 HOURS AFTER INGESTION AND >50 ug/mL AT 12 HOURS AFTER INGESTION ARE OFTEN ASSOCIATED WITH TOXIC REACTIONS.   cbc     Status: None   Collection Time: 01/10/17  8:40 PM  Result Value Ref Range   WBC 7.5 3.8 - 10.6 K/uL   RBC 5.46 4.40 - 5.90 MIL/uL   Hemoglobin 16.2 13.0 - 18.0 g/dL   HCT 47.8 40.0 - 52.0 %   MCV 87.7 80.0 - 100.0 fL   MCH 29.8 26.0 - 34.0 pg   MCHC 33.9 32.0 - 36.0 g/dL   RDW 13.8 11.5 - 14.5 %   Platelets 336 150 - 440 K/uL    Blood Alcohol level:  Lab Results  Component Value Date   ETH 235 (H) 01/10/2017   ETH <5 85/46/2703    Metabolic Disorder Labs:  Lab Results  Component Value Date   HGBA1C  5.3 07/30/2016   MPG 105 07/30/2016   No results found for: PROLACTIN Lab Results  Component Value Date   CHOL 187 07/30/2016   TRIG 73 07/30/2016   HDL 85 07/30/2016   CHOLHDL 2.2 07/30/2016   VLDL 15 07/30/2016   LDLCALC 87 07/30/2016    Current Medications: Current Facility-Administered Medications  Medication Dose Route Frequency Provider Last Rate Last Dose  . acetaminophen (TYLENOL) tablet 650 mg  650 mg Oral Q6H PRN  Adenike Shidler B, MD      . albuterol (PROVENTIL) (2.5 MG/3ML) 0.083% nebulizer solution 3 mL  3 mL Inhalation Q6H PRN Omar Orrego B, MD      . alum & mag hydroxide-simeth (MAALOX/MYLANTA) 200-200-20 MG/5ML suspension 30 mL  30 mL Oral Q4H PRN Ionna Avis B, MD      . hydrOXYzine (ATARAX/VISTARIL) tablet 50 mg  50 mg Oral TID PRN Hancel Ion B, MD      . magnesium hydroxide (MILK OF MAGNESIA) suspension 30 mL  30 mL Oral Daily PRN Ananya Mccleese B, MD      . nicotine (NICODERM CQ - dosed in mg/24 hours) patch 21 mg  21 mg Transdermal Q0600 Boyce Keltner B, MD   21 mg at 01/12/17 0630  . QUEtiapine (SEROQUEL) tablet 100 mg  100 mg Oral QHS Margit Batte B, MD   100 mg at 01/11/17 2203   PTA Medications: Prescriptions Prior to Admission  Medication Sig Dispense Refill Last Dose  . albuterol (PROVENTIL HFA;VENTOLIN HFA) 108 (90 Base) MCG/ACT inhaler Inhale 2 puffs into the lungs every 6 (six) hours as needed for wheezing or shortness of breath. 1 Inhaler 0   . ARIPiprazole (ABILIFY) 5 MG tablet Take 1 tablet (5 mg total) by mouth daily. 30 tablet 1   . azithromycin (ZITHROMAX Z-PAK) 250 MG tablet Take 2 tablets (500 mg) on  Day 1,  followed by 1 tablet (250 mg) once daily on Days 2 through 5. 6 each 0   . HYDROcodone-acetaminophen (NORCO/VICODIN) 5-325 MG tablet Take 1 tablet by mouth every 6 (six) hours as needed for moderate pain. 6 tablet 0   . ibuprofen (ADVIL,MOTRIN) 600 MG tablet Take 1 tablet (600 mg total) by mouth every 6 (six) hours as needed. 30 tablet 0   . ondansetron (ZOFRAN) 8 MG tablet Take 1 tablet (8 mg total) by mouth every 8 (eight) hours as needed for nausea or vomiting. 20 tablet 0   . Oxcarbazepine (TRILEPTAL) 300 MG tablet Take 1 tablet (300 mg total) by mouth 2 (two) times daily. 60 tablet 1   . prazosin (MINIPRESS) 1 MG capsule Take 1 capsule (1 mg total) by mouth 2 (two) times daily. 60 capsule 1   . predniSONE (DELTASONE) 10 MG  tablet Take 6 tablets on day 1, take 5 tablets on day 2, take 4 tablets on day 3, take 3 tablets on day 4, take 2 tablets on day 5, take 1 tablet on day 6 21 tablet 0   . QUEtiapine (SEROQUEL) 50 MG tablet Take 1 tablet (50 mg total) by mouth at bedtime. 30 tablet 1   . traZODone (DESYREL) 100 MG tablet Take 1 tablet (100 mg total) by mouth at bedtime. 30 tablet 1     Musculoskeletal: Strength & Muscle Tone: within normal limits Gait & Station: normal Patient leans: N/A  Psychiatric Specialty Exam: Physical Exam  Nursing note and vitals reviewed. Constitutional: He is oriented to person, place, and time. He appears well-developed and well-nourished.  HENT:  Head: Normocephalic and atraumatic.  Eyes: Pupils are equal, round, and reactive to light. Conjunctivae are normal.  Neck: Normal range of motion. Neck supple.  Cardiovascular: Normal rate, regular rhythm and normal heart sounds.   Respiratory: Effort normal and breath sounds normal.  GI: Soft. Bowel sounds are normal.  Musculoskeletal: Normal range of motion.  Neurological: He is alert and oriented to person, place, and time.  Skin: Skin is warm and dry.  Psychiatric: His affect is blunt. His speech is delayed. He is slowed and withdrawn. Cognition and memory are normal. He expresses impulsivity. He exhibits a depressed mood. He expresses suicidal ideation. He expresses suicidal plans.    Review of Systems  Psychiatric/Behavioral: Positive for depression, substance abuse and suicidal ideas.  All other systems reviewed and are negative.   Blood pressure 102/64, pulse 73, temperature 98 F (36.7 C), temperature source Oral, resp. rate 16, height 5' 4"  (1.626 m), weight 66.5 kg (146 lb 8 oz), SpO2 100 %.Body mass index is 25.15 kg/m.  See SRA.                                                  Sleep:  Number of Hours: 7.45    Treatment Plan Summary: Daily contact with patient to assess and evaluate  symptoms and progress in treatment and Medication management   Mr. Matheson is a 31 year old male with history of bipolar disorder and substance abuse admitted for suicidal ideation in the context of treatment noncompliance and substance use.   1. Suicidal ideation. The patient is able to contract for safety in the hospital.    2. Mood. The patient does not believe medication has ever been helpful.   3. Anxiety. We restarted Minipress for nightmares and flashbacks. Blood pressure is low.   4. Alcohol abuse. He is very sedated from Librium. I will discontinue taper. He reports drinkig "few beers" daily.   5. Substance abuse treatment. The patient is unsure.    6. Insomnia. We will give Seroquel.    7. Metabolic syndrome monitoring. Lipid panel, TSH and hemoglobin A1c were recently done.    8. EKG. Normal sinus rhythm. QTC 392.   9. Disposition. Homeless shelter. Last time he was discharged to [facility] but never got there. He will follow up with [facility].   Observation Level/Precautions:  15 minute checks  Laboratory:  CBC Chemistry Profile UDS UA  Psychotherapy:    Medications:    Consultations:    Discharge Concerns:    Estimated LOS:  Other:     Physician Treatment Plan for Primary Diagnosis: Bipolar I disorder, most recent episode depressed, severe without psychotic features ([place]) Long Term Goal(s): Improvement in symptoms so as ready for discharge  Short Term Goals: Ability to identify changes in lifestyle to reduce recurrence of condition will improve, Ability to verbalize feelings will improve, Ability to disclose and discuss suicidal ideas, Ability to demonstrate self-control will improve, Ability to identify and develop effective coping behaviors will improve, Ability to maintain clinical measurements within normal limits will improve, Compliance with prescribed medications will improve and Ability to identify triggers associated with substance abuse/mental  health issues will improve  Physician Treatment Plan for Secondary Diagnosis: Principal Problem:   Bipolar I disorder, most recent episode depressed, severe without psychotic features ([place]) Active Problems:   Tobacco use disorder  PTSD (post-traumatic stress disorder)   Alcohol use disorder, moderate, dependence ([facility])   Stimulant use disorder  Long Term Goal(s): Improvement in symptoms so as ready for discharge  Short Term Goals: Ability to identify changes in lifestyle to reduce recurrence of condition will improve, Ability to demonstrate self-control will improve and Ability to identify triggers associated with substance abuse/mental health issues will improve  I certify that inpatient services furnished can reasonably be expected to improve the patient's condition.    Orson Slick, MD 9/9/20182:22 PM

## 2017-01-12 NOTE — [facility] Suicide Risk Assessment (Signed)
[facility]   Nursing information obtained from:  Patient Demographic factors:  Male, Low socioeconomic status, Living alone, Unemployed Current Mental Status:  NA Loss Factors:  NA Historical Factors:  NA Risk Reduction Factors:  NA  Total Time spent with patient: 1 hour Principal Problem: Bipolar I disorder, most recent episode depressed, severe without psychotic features ([facility]) Diagnosis:   Patient Active Problem List   Diagnosis Date Noted  . 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 Tobacco use disorder [F17.200] 07/29/2016  . PTSD (post-traumatic stress disorder) [F43.10] 07/29/2016  . Alcohol use disorder, moderate, dependence ([facility]) [F10.20] 07/29/2016  . Bipolar I disorder, most recent episode depressed, severe without psychotic features ([facility]) [F31.4] 07/29/2016   Subjective Data: suicidal ideation.  Continued Clinical Symptoms:  Alcohol Use Disorder Identification Test Final Score (AUDIT): 15 The "Alcohol Use Disorders Identification Test", Guidelines for Use in Primary Care, Second Edition.  World Science writerHealth Organization [facility](WHO). Score between 0-7:  no or low risk or alcohol related problems. Score between 8-15:  moderate risk of alcohol related problems. Score between 16-19:  high risk of alcohol related problems. Score 20 or above:  warrants further diagnostic evaluation for alcohol dependence and treatment.   CLINICAL FACTORS:   Bipolar Disorder:   Depressive phase Depression:   Comorbid alcohol abuse/dependence Impulsivity Alcohol/Substance Abuse/Dependencies   Musculoskeletal: Strength & Muscle Tone: within normal limits Gait & Station: normal Patient leans: N/A  Psychiatric Specialty Exam: Physical Exam  Nursing note and vitals reviewed. Psychiatric: His affect is blunt. His speech is delayed. He is slowed and withdrawn. Cognition and memory are normal. He expresses impulsivity. He exhibits a depressed mood. He expresses suicidal  ideation. He expresses suicidal plans.    Review of Systems  Psychiatric/Behavioral: Positive for depression and substance abuse.  All other systems reviewed and are negative.   Blood pressure 102/64, pulse 73, temperature 98 F (36.7 C), temperature source Oral, resp. rate 16, height 5\' 4"  (1.626 m), weight 66.5 kg (146 lb 8 oz), SpO2 100 %.Body mass index is 25.15 kg/m.  General Appearance: Fairly Groomed  Eye Contact:  Minimal  Speech:  Slow  Volume:  Normal  Mood:  Dysphoric  Affect:  Blunt  Thought Process:  Goal Directed and Descriptions of Associations: Intact  Orientation:  Full (Time, Place, and Person)  Thought Content:  WDL  Suicidal Thoughts:  Yes.  with intent/plan  Homicidal Thoughts:  No  Memory:  Immediate;   Fair Recent;   Fair Remote;   Fair  Judgement:  Poor  Insight:  Lacking  Psychomotor Activity:  Psychomotor Retardation  Concentration:  Concentration: Fair and Attention Span: Fair  Recall:  [employer]ervFair  Fund of Knowledge:  Fair  Language:  Fair  Akathisia:  No  Handed:  Right  AIMS (if indicated):     Assets:  Communication Skills Desire for Improvement Physical Health Resilience  ADL's:  Intact  Cognition:  WNL  Sleep:  Number of Hours: 7.45      COGNITIVE FEATURES THAT CONTRIBUTE TO RISK:  None    SUICIDE RISK:   Moderate:  Frequent suicidal ideation with limited intensity, and duration, some specificity in terms of plans, no associated intent, good self-control, limited dysphoria/symptomatology, some risk factors present, and identifiable protective factors, including available and accessible social support.  PLAN OF CARE: hospital admission, medication management, substance abuse counseling, discharge planning.  [name] is a 31 year old male with history of bipolar disorder and substance abuse admitted for suicidal ideation in  the context of treatment noncompliance and substance use.   1. Suicidal ideation. The patient is able to  contract for safety in the hospital.    2. Mood. The patient does not believe medication has ever been helpful.   3. Anxiety. We restarted Minipress for nightmares and flashbacks. Blood pressure is low.   4. Alcohol abuse. He is very sedated from Librium. I will discontinue taper. He reports drinkig "few beers" daily.   5. Substance abuse treatment. The patient is unsure.    6. Insomnia. We will give Seroquel.    7. Metabolic syndrome monitoring. Lipid panel, TSH and hemoglobin A1c were recently done.    8. EKG. Normal sinus rhythm. QTC 392.   9. Disposition. Homeless shelter. Last time he was discharged [facility] but never got there. He will follow up with [facility].    I certify that inpatient services furnished can reasonably be expected to improve the patient's condition.   Kristine Linea, MD 01/12/2017, 2:16 PM

## 2017-01-12 NOTE — Progress Notes (Signed)
Patient got up for vital signs but was dizzy and had trouble keeping his eyes open.  He leaned against the wall and was assisted back to his room.  Vitals signs were done at the bedside.

## 2017-01-13 MED ORDER — QUETIAPINE FUMARATE 25 MG PO TABS
50.0000 mg | ORAL_TABLET | Freq: Two times a day (BID) | ORAL | Status: DC | PRN
  Administered 2017-01-13: 50 mg via ORAL
  Filled 2017-01-13 (×2): qty 2

## 2017-01-13 NOTE — Progress Notes (Signed)
[facility] MD Progress Note  01/13/2017 2:55 PM Roger Hall  MRN:  [id number]  Subjective:   01/13/2017. Roger Hall met with treatment team today but was sleepy and unable to fully participate. He wants help for "anxiety, depressiion, drugs, everything". He was unwilling to meet with Maryan Char, [facility] representative today. Has no somatic complaints.  Per nursing: Patient is alert and oriented to person and place. Skin is warm, dry and intact. No limitations to all four extremities noted. Patientdenies SI, AVH but endorses depression  a 10 out of a 10 scale. Isolative to room most of day, states "I am going to try to get out and socialize today." PO fluids encouraged and provided.  Patient was observed ambulating in hall during the shift with a steady gait. Attends meals with frequent encouragement from staff, minimalpeer interaction noted.Milieu remains therapeutic. Patient will be monitored and physician notified of any acute changes.  Principal Problem: Bipolar I disorder, most recent episode depressed, severe without psychotic features ([place]) Diagnosis:   Patient Active Problem List   Diagnosis Date Noted  . 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 Tobacco use disorder [F17.200] 07/29/2016  . PTSD (post-traumatic stress disorder) [F43.10] 07/29/2016  . Alcohol use disorder, moderate, dependence ([place]) [F10.20] 07/29/2016  . Bipolar I disorder, most recent episode depressed, severe without psychotic features ([place]) [F31.4] 07/29/2016   Total Time spent with patient: 30 minutes  Past Psychiatric History: bipolar disorder, substance abuse.  Past Medical History:  Past Medical History:  Diagnosis Date  . Asthma   . Depression    History reviewed. No pertinent surgical history. Family History:  Family History  Problem Relation Age of Onset  . Depression Mother    Family Psychiatric  History: see H&P. Social History:  History  Alcohol Use  . 3.6 oz/week  . 6 Cans of  beer per week    Comment: daily     History  Drug use: Unknown    Social History   Social History  . Marital status: Single    Spouse name: N/A  . Number of children: N/A  . Years of education: N/A   Social History Main Topics  . Smoking status: Current Every Day Smoker    Packs/day: 0.50    Years: 10.00    Types: Cigarettes  . Smokeless tobacco: Never Used  . Alcohol use 3.6 oz/week    6 Cans of beer per week     Comment: daily  . Drug use: Unknown  . Sexual activity: Not Currently   Other Topics Concern  . None   Social History Narrative  . None   Additional Social History:                         Sleep: Poor  Appetite:  Fair  Current Medications: Current Facility-Administered Medications  Medication Dose Route Frequency Provider Last Rate Last Dose  . acetaminophen (TYLENOL) tablet 650 mg  650 mg Oral Q6H PRN ,  B, MD      . albuterol (PROVENTIL) (2.5 MG/3ML) 0.083% nebulizer solution 3 mL  3 mL Inhalation Q6H PRN ,  B, MD      . alum & mag hydroxide-simeth (MAALOX/MYLANTA) 200-200-20 MG/5ML suspension 30 mL  30 mL Oral Q4H PRN ,  B, MD      . feeding supplement (ENSURE ENLIVE) (ENSURE ENLIVE) liquid 237 mL  237 mL Oral BID BM ,  B, MD   237 mL at 01/13/17 1439  .  hydrOXYzine (ATARAX/VISTARIL) tablet 50 mg  50 mg Oral TID PRN Edeline Greening B, MD   50 mg at 01/12/17 2144  . magnesium hydroxide (MILK OF MAGNESIA) suspension 30 mL  30 mL Oral Daily PRN Theola Cuellar B, MD      . nicotine (NICODERM CQ - dosed in mg/24 hours) patch 21 mg  21 mg Transdermal Q0600 Federick Levene B, MD   21 mg at 01/13/17 0627  . QUEtiapine (SEROQUEL) tablet 100 mg  100 mg Oral QHS Virgina Deakins B, MD   100 mg at 01/12/17 2144    Lab Results:  No results found for this or any previous visit (from the past 48 hour(s)).  Blood Alcohol level:  Lab Results  Component Value Date   ETH  235 (H) 01/10/2017   ETH <5 02/54/2706    Metabolic Disorder Labs: Lab Results  Component Value Date   HGBA1C 5.3 07/30/2016   MPG 105 07/30/2016   No results found for: PROLACTIN Lab Results  Component Value Date   CHOL 187 07/30/2016   TRIG 73 07/30/2016   HDL 85 07/30/2016   CHOLHDL 2.2 07/30/2016   VLDL 15 07/30/2016   LDLCALC 87 07/30/2016    Physical Findings: AIMS:  , ,  ,  ,    CIWA:  CIWA-Ar Total: 0 COWS:     Musculoskeletal: Strength & Muscle Tone: within normal limits Gait & Station: normal Patient leans: N/A  Psychiatric Specialty Exam: Physical Exam  Nursing note and vitals reviewed. Psychiatric: His affect is blunt. His speech is delayed. He is slowed and withdrawn. Cognition and memory are normal. He expresses impulsivity. He exhibits a depressed mood. He expresses suicidal ideation. He expresses suicidal plans.    Review of Systems  Psychiatric/Behavioral: Positive for depression, substance abuse and suicidal ideas.  All other systems reviewed and are negative.   Blood pressure 106/61, pulse (!) 57, temperature 97.7 F (36.5 C), temperature source Oral, resp. rate 18, height 5' 4" (1.626 m), weight 66.5 kg (146 lb 8 oz), SpO2 99 %.Body mass index is 25.15 kg/m.  General Appearance: Fairly Groomed  Eye Contact:  Minimal  Speech:  Clear and Coherent  Volume:  Normal  Mood:  Depressed  Affect:  Blunt  Thought Process:  Goal Directed and Descriptions of Associations: Intact  Orientation:  Full (Time, Place, and Person)  Thought Content:  WDL  Suicidal Thoughts:  Yes.  with intent/plan  Homicidal Thoughts:  No  Memory:  Immediate;   Fair Recent;   Fair Remote;   Fair  Judgement:  Poor  Insight:  Lacking  Psychomotor Activity:  Psychomotor Retardation  Concentration:  Concentration: Fair and Attention Span: Fair  Recall:  [employer] of Knowledge:  Fair  Language:  Fair  Akathisia:  No  Handed:  Right  AIMS (if indicated):     Assets:   Communication Skills Desire for Improvement Physical Health Resilience  ADL's:  Intact  Cognition:  WNL  Sleep:  Number of Hours: 7.3     Treatment Plan Summary: Daily contact with patient to assess and evaluate symptoms and progress in treatment and Medication management   Roger Hall is a 31 year old male with history of bipolar disorder and substance abuse admitted for suicidal ideation in the context of treatment noncompliance and substance use.   1. Suicidal ideation. The patient is able to contract for safety in the hospital.   2. Mood. The patient does not believe medication has ever been helpful.  3. Anxiety. We restarted Minipress for nightmares and flashbacks. Blood pressure is low.   4. Alcohol abuse. He is very sedated from Librium. I will discontinue taper. He reports drinkig "few beers" daily. Vital signs are stable.  5. Substance abuse treatment. The patient is unsure.    6. Insomnia. We will give Seroquel.    7. Metabolic syndrome monitoring. Lipid panel, TSH and hemoglobin A1c were recently done.    8. EKG. Normal sinus rhythm. QTC 392.   9. Disposition. Homeless shelter. Last time he was discharged to [facility] but never got there. He will follow up with [facility].  Orson Slick, MD 01/13/2017, 2:55 PM

## 2017-01-13 NOTE — Plan of Care (Signed)
Problem: Coping: Goal: Ability to verbalize frustrations and anger appropriately will improve Outcome: Progressing Patient came to the dayroom for group and HS snack.  He was pleasant on contact but had minimal interactions with peers or staff.  He admits to feeling anxious (6/10) and "just needing to "rest".   He gave Clinical research associatewriter a urine specimen and could not recall when he had obtained the specimen (for urine drug screen).  Specimen was discarded and Patient was requested to obtain another one.  He agreed.  Problem: Physical Regulation: Goal: Complications related to the disease process, condition or treatment will be avoided or minimized Outcome: Progressing Patient reports feeling "anxious inside" and asked for writer to review medications.  He took Vistaril at 2130 and was observed sleeping by 2230.

## 2017-01-13 NOTE — [facility] Group Notes (Signed)
[facility] LCSW Group Therapy Note  Date/Time: 01/13/17, 0930  Type of Therapy and Topic:  Group Therapy:  Overcoming Obstacles  Participation Level:  Did not attend  Description of Group:    In this group patients will be encouraged to explore what they see as obstacles to their own wellness and recovery. They will be guided to discuss their thoughts, feelings, and behaviors related to these obstacles. The group will process together ways to cope with barriers, with attention given to specific choices patients can make. Each patient will be challenged to identify changes they are motivated to make in order to overcome their obstacles. This group will be process-oriented, with patients participating in exploration of their own experiences as well as giving and receiving support and challenge from other group members.  Therapeutic Goals: 1. Patient will identify personal and current obstacles as they relate to admission. 2. Patient will identify barriers that currently interfere with their wellness or overcoming obstacles.  3. Patient will identify feelings, thought process and behaviors related to these barriers. 4. Patient will identify two changes they are willing to make to overcome these obstacles:    Summary of Patient Progress      Therapeutic Modalities:   Cognitive Behavioral Therapy Solution Focused Therapy Motivational Interviewing Relapse Prevention Therapy  Greg Corrisa Gibby, LCSW 

## 2017-01-13 NOTE — [facility] Group Notes (Signed)
[facility] Group Notes:  (Nursing/MHT/Case Management/Adjunct)  Date:  01/13/2017  Time:  10:33 PM  Type of Therapy:  Group Therapy  Participation Level:  Active  Participation Quality:  Appropriate  Affect:  Appropriate  Cognitive:  Appropriate  Insight:  Appropriate  Engagement in Group:  Engaged  Modes of Intervention:  Discussion  Summary of Progress/Problems:  Burt EkJanice Marie Evelynne Spiers 01/13/2017, 10:33 PM

## 2017-01-13 NOTE — Plan of Care (Signed)
Problem: Safety: Goal: Ability to remain free from injury will improve Outcome: Progressing Patient is safe and secure no injury to selfe and others monitored every 15 minutes by care team  Problem: Pain Managment: Goal: General experience of comfort will improve Outcome: Progressing Patient denies any pain at this time   Problem: Activity: Goal: Risk for activity intolerance will decrease Outcome: Progressing Patient is participating in group activities with peers

## 2017-01-13 NOTE — Progress Notes (Signed)
CSW facilitated a conversation with pt and [facility]  [place] was very noncommittal to [place] and to CSW stating he wants to "think it over." [facility]  [facility].  Pt stated [facility]. Garner NashGregory Khole Branch, MSW, LCSW Clinical Social Worker 01/13/2017 3:09 PM

## 2017-01-13 NOTE — Plan of Care (Signed)
Problem: Education: Goal: Knowledge of disease or condition will improve Outcome: Not Met (add Reason) Patient not receptive to education at this time  Problem: Health Behavior/Discharge Planning: Goal: Ability to identify changes in lifestyle to reduce recurrence of condition will improve Outcome: Not Progressing Patient not engaged to the conversation of lifestyle changes to reduce the recurrence of relapse.  Problem: Physical Regulation: Goal: Complications related to the disease process, condition or treatment will be avoided or minimized Outcome: Not Progressing Patient not receptive to the education provided to the patients.

## 2017-01-13 NOTE — Progress Notes (Signed)
Patient is alert and oriented to person and place. Skin is warm, dry and intact. No limitations to all four extremities noted. Patient denies SI, AVH but endorses depression  a 10 out of a 10 scale. Isolative to room most of day, states "I am going to try to get out and socialize today." PO fluids encouraged and provided.  Patient was observed ambulating in hall during the shift with a steady gait. Attends meals with frequent encouragement from staff, minimal peer interaction noted. Milieu remains therapeutic. Patient will be monitored and physician notified of any acute changes.

## 2017-01-13 NOTE — Tx Team (Signed)
Interdisciplinary Treatment and Diagnostic Plan Update  01/13/2017 Time of Session: [address] Babich MRN: [id number]  Principal Diagnosis: Bipolar I disorder, most recent episode depressed, severe without psychotic features (HCC)  Secondary Diagnoses: Principal Problem:   Bipolar I disorder, most recent episode depressed, severe without psychotic features (HCC) Active Problems:   Tobacco use disorder   PTSD (post-traumatic stress disorder)   Alcohol use disorder, moderate, dependence (HCC)   Stimulant use disorder   Current Medications:  Current Facility-Administered Medications  Medication Dose Route Frequency Provider Last Rate Last Dose  . acetaminophen (TYLENOL) tablet 650 mg  650 mg Oral Q6H PRN Pucilowska, Jolanta B, MD      . albuterol (PROVENTIL) (2.5 MG/3ML) 0.083% nebulizer solution 3 mL  3 mL Inhalation Q6H PRN Pucilowska, Jolanta B, MD      . alum & mag hydroxide-simeth (MAALOX/MYLANTA) 200-200-20 MG/5ML suspension 30 mL  30 mL Oral Q4H PRN Pucilowska, Jolanta B, MD      . feeding supplement (ENSURE ENLIVE) (ENSURE ENLIVE) liquid 237 mL  237 mL Oral BID BM Pucilowska, Jolanta B, MD   237 mL at 01/13/17 1024  . hydrOXYzine (ATARAX/VISTARIL) tablet 50 mg  50 mg Oral TID PRN Pucilowska, Jolanta B, MD   50 mg at 01/12/17 2144  . magnesium hydroxide (MILK OF MAGNESIA) suspension 30 mL  30 mL Oral Daily PRN Pucilowska, Jolanta B, MD      . nicotine (NICODERM CQ - dosed in mg/24 hours) patch 21 mg  21 mg Transdermal Q0600 Pucilowska, Jolanta B, MD   21 mg at 01/13/17 0627  . QUEtiapine (SEROQUEL) tablet 100 mg  100 mg Oral QHS Pucilowska, Jolanta B, MD   100 mg at 01/12/17 2144   PTA Medications: Prescriptions Prior to Admission  Medication Sig Dispense Refill Last Dose  . albuterol (PROVENTIL HFA;VENTOLIN HFA) 108 (90 Base) MCG/ACT inhaler Inhale 2 puffs into the lungs every 6 (six) hours as needed for wheezing or shortness of breath. 1 Inhaler 0 unknown at unknown   . ARIPiprazole (ABILIFY) 5 MG tablet Take 1 tablet (5 mg total) by mouth daily. 30 tablet 1 unknown at unknown  . azithromycin (ZITHROMAX Z-PAK) 250 MG tablet Take 2 tablets (500 mg) on  Day 1,  followed by 1 tablet (250 mg) once daily on Days 2 through 5. 6 each 0 unknown at unknown  . HYDROcodone-acetaminophen (NORCO/VICODIN) 5-325 MG tablet Take 1 tablet by mouth every 6 (six) hours as needed for moderate pain. 6 tablet 0 unknown at unknown  . ibuprofen (ADVIL,MOTRIN) 600 MG tablet Take 1 tablet (600 mg total) by mouth every 6 (six) hours as needed. 30 tablet 0 unknown at unknown  . ondansetron (ZOFRAN) 8 MG tablet Take 1 tablet (8 mg total) by mouth every 8 (eight) hours as needed for nausea or vomiting. 20 tablet 0 unknown at unknown  . Oxcarbazepine (TRILEPTAL) 300 MG tablet Take 1 tablet (300 mg total) by mouth 2 (two) times daily. 60 tablet 1 unknown at unknown  . prazosin (MINIPRESS) 1 MG capsule Take 1 capsule (1 mg total) by mouth 2 (two) times daily. 60 capsule 1 unknown at unknown  . predniSONE (DELTASONE) 10 MG tablet Take 6 tablets on day 1, take 5 tablets on day 2, take 4 tablets on day 3, take 3 tablets on day 4, take 2 tablets on day 5, take 1 tablet on day 6 21 tablet 0 unknown at unknown  . QUEtiapine (SEROQUEL) 50 MG tablet Take 1 tablet (50  mg total) by mouth at bedtime. 30 tablet 1 unknown at unknown  . traZODone (DESYREL) 100 MG tablet Take 1 tablet (100 mg total) by mouth at bedtime. 30 tablet 1 unknown at unknown    Patient Stressors: Substance abuse  Patient Strengths: Ability for insight Motivation for treatment/growth  Treatment Modalities: Medication Management, Group therapy, Case management,  1 to 1 session with clinician, Psychoeducation, Recreational therapy.   Physician Treatment Plan for Primary Diagnosis: Bipolar I disorder, most recent episode depressed, severe without psychotic features (HCC) Long Term Goal(s): Improvement in symptoms so as ready for  discharge Improvement in symptoms so as ready for discharge   Short Term Goals: Ability to identify changes in lifestyle to reduce recurrence of condition will improve Ability to verbalize feelings will improve Ability to disclose and discuss suicidal ideas Ability to demonstrate self-control will improve Ability to identify and develop effective coping behaviors will improve Ability to maintain clinical measurements within normal limits will improve Compliance with prescribed medications will improve Ability to identify triggers associated with substance abuse/mental health issues will improve Ability to identify changes in lifestyle to reduce recurrence of condition will improve Ability to demonstrate self-control will improve Ability to identify triggers associated with substance abuse/mental health issues will improve  Medication Management: Evaluate patient's response, side effects, and tolerance of medication regimen.  Therapeutic Interventions: 1 to 1 sessions, Unit Group sessions and Medication administration.  Evaluation of Outcomes: Progressing  Physician Treatment Plan for Secondary Diagnosis: Principal Problem:   Bipolar I disorder, most recent episode depressed, severe without psychotic features (HCC) Active Problems:   Tobacco use disorder   PTSD (post-traumatic stress disorder)   Alcohol use disorder, moderate, dependence (HCC)   Stimulant use disorder  Long Term Goal(s): Improvement in symptoms so as ready for discharge Improvement in symptoms so as ready for discharge   Short Term Goals: Ability to identify changes in lifestyle to reduce recurrence of condition will improve Ability to verbalize feelings will improve Ability to disclose and discuss suicidal ideas Ability to demonstrate self-control will improve Ability to identify and develop effective coping behaviors will improve Ability to maintain clinical measurements within normal limits will  improve Compliance with prescribed medications will improve Ability to identify triggers associated with substance abuse/mental health issues will improve Ability to identify changes in lifestyle to reduce recurrence of condition will improve Ability to demonstrate self-control will improve Ability to identify triggers associated with substance abuse/mental health issues will improve     Medication Management: Evaluate patient's response, side effects, and tolerance of medication regimen.  Therapeutic Interventions: 1 to 1 sessions, Unit Group sessions and Medication administration.  Evaluation of Outcomes: Progressing   RN Treatment Plan for Primary Diagnosis: Bipolar I disorder, most recent episode depressed, severe without psychotic features (HCC) Long Term Goal(s): Knowledge of disease and therapeutic regimen to maintain health will improve  Short Term Goals: Ability to identify and develop effective coping behaviors will improve and Compliance with prescribed medications will improve  Medication Management: RN will administer medications as ordered by provider, will assess and evaluate patient's response and provide education to patient for prescribed medication. RN will report any adverse and/or side effects to prescribing provider.  Therapeutic Interventions: 1 on 1 counseling sessions, Psychoeducation, Medication administration, Evaluate responses to treatment, Monitor vital signs and CBGs as ordered, Perform/monitor CIWA, COWS, AIMS and Fall Risk screenings as ordered, Perform wound care treatments as ordered.  Evaluation of Outcomes: Progressing   LCSW Treatment Plan for Primary Diagnosis: Bipolar  I disorder, most recent episode depressed, severe without psychotic features (HCC) Long Term Goal(s): Safe transition to appropriate next level of care at discharge, Engage patient in therapeutic group addressing interpersonal concerns.  Short Term Goals: Engage patient in aftercare  planning with referrals and resources, Increase social support and Increase skills for wellness and recovery  Therapeutic Interventions: Assess for all discharge needs, 1 to 1 time with Social worker, Explore available resources and support systems, Assess for adequacy in community support network, Educate family and significant other(s) on suicide prevention, Complete Psychosocial Assessment, Interpersonal group therapy.  Evaluation of Outcomes: Progressing   Progress in Treatment: Attending groups: No. Participating in groups: No. Taking medication as prescribed: Yes. Toleration medication: Yes. Family/Significant other contact made: No, will contact:  pt refused Patient understands diagnosis: Yes. Discussing patient identified problems/goals with staff: Yes. Medical problems stabilized or resolved: Yes. Denies suicidal/homicidal ideation: Yes. Issues/concerns per patient self-inventory: No. Other: none  New problem(s) identified: No, Describe:  none  New Short Term/Long Term Goal(s): Pt goal: "I want treatment for everything: anxiety, depression and alcohol."  Discharge Plan or Barriers: CSW assessing for appropriate plan.  Reason for Continuation of Hospitalization: Depression Medication stabilization  Estimated Length of Stay:2-3 days. Attendees: Patient:Roger Hall 01/13/2017   Physician: Dr. Jennet Maduro, MD 01/13/2017   Nursing: Shelia Media, RN 01/13/2017   RN Care Manager: 01/13/2017   Social Worker: Daleen Squibb, LCSW 01/13/2017   Recreational Therapist:  01/13/2017   Other:  01/13/2017   Other:  01/13/2017   Other: 01/13/2017         Scribe for Treatment Team: Lorri Frederick, LCSW 01/13/2017 2:15 PM

## 2017-01-14 NOTE — Progress Notes (Signed)
Patient slept off an on today. He denies SI, HI, AVH. He is pleasant and cooperative and takes his meds. He denies any other complaints. Will continue to monitor.

## 2017-01-14 NOTE — [facility] Group Notes (Signed)
[facility] Group Notes:  (Nursing/MHT/Case Management/Adjunct)  Date:  01/14/2017  Time:  7:26 PM  Type of Therapy:  Psychoeducational Skills  Participation Level:  Active  Participation Quality:  Appropriate and Attentive  Affect:  Appropriate and Flat  Cognitive:  Alert and Appropriate  Insight:  Appropriate, Good and Lacking  Engagement in Group:  Engaged  Modes of Intervention:  Discussion, Education and Socialization  Summary of Progress/Problems:  Roger Hall  Roger Hall 01/14/2017, 7:26 PM

## 2017-01-14 NOTE — Progress Notes (Signed)
[facility] MD Progress Note  01/14/2017 6:17 PM [facility] [place]  MRN:  [id number]  Subjective:   01/13/2017. Mr. Grieb met with treatment team today but was sleepy and unable to fully participate. He wants help for "anxiety, depressiion, drugs, everything". He was unwilling to meet with Maryan Char, [facility] representative today. Has no somatic complaints.  01/14/2017. Mr. Mindi Junker for his anything about today. He complains of depression, anxiety, poor energy. He is not however interested in substance abuse treatment. [facility] [place]  Per nursing: Patient is safe and secure no injury to selfe and others monitored every 15 minutes by care team  Principal Problem: Bipolar I disorder, most recent episode depressed, severe without psychotic features [facility]) Diagnosis:   Patient Active Problem List   Diagnosis Date Noted  . 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 Tobacco use disorder [F17.200] 07/29/2016  . PTSD (post-traumatic stress disorder) [F43.10] 07/29/2016  . Alcohol use disorder, moderate, dependence ([place]) [F10.20] 07/29/2016  . Bipolar I disorder, most recent episode depressed, severe without psychotic features ([place]) [F31.4] 07/29/2016   Total Time spent with patient: 30 minutes  Past Psychiatric History: bipolar disorder, substance abuse.  Past Medical History:  Past Medical History:  Diagnosis Date  . Asthma   . Depression    History reviewed. No pertinent surgical history. Family History:  Family History  Problem Relation Age of Onset  . Depression Mother    Family Psychiatric  History: see H&P. Social History:  History  Alcohol Use  . 3.6 oz/week  . 6 Cans of beer per week    Comment: daily     History  Drug use: Unknown    Social History   Social History  . Marital status: Single    Spouse name: N/A  . Number of children: N/A  . Years of education: N/A   Social History Main Topics  . Smoking status: Current Every Day  Smoker    Packs/day: 0.50    Years: 10.00    Types: Cigarettes  . Smokeless tobacco: Never Used  . Alcohol use 3.6 oz/week    6 Cans of beer per week     Comment: daily  . Drug use: Unknown  . Sexual activity: Not Currently   Other Topics Concern  . None   Social History Narrative  . None   Additional Social History:                         Sleep: Poor  Appetite:  Fair  Current Medications: Current Facility-Administered Medications  Medication Dose Route Frequency Provider Last Rate Last Dose  . acetaminophen (TYLENOL) tablet 650 mg  650 mg Oral Q6H PRN Ifeanyi Mickelson B, MD      . albuterol (PROVENTIL) (2.5 MG/3ML) 0.083% nebulizer solution 3 mL  3 mL Inhalation Q6H PRN Odies Desa B, MD      . alum & mag hydroxide-simeth (MAALOX/MYLANTA) 200-200-20 MG/5ML suspension 30 mL  30 mL Oral Q4H PRN Madelynne Lasker B, MD      . feeding supplement (ENSURE ENLIVE) (ENSURE ENLIVE) liquid 237 mL  237 mL Oral BID BM Lynkin Saini B, MD   237 mL at 01/14/17 1512  . hydrOXYzine (ATARAX/VISTARIL) tablet 50 mg  50 mg Oral TID PRN Talishia Betzler B, MD   50 mg at 01/12/17 2144  . magnesium hydroxide (MILK OF MAGNESIA) suspension 30 mL  30 mL Oral Daily PRN Adlee Paar B, MD      .  nicotine (NICODERM CQ - dosed in mg/24 hours) patch 21 mg  21 mg Transdermal Q0600 Jamile Sivils B, MD   21 mg at 01/14/17 0626  . QUEtiapine (SEROQUEL) tablet 50 mg  50 mg Oral BID BM & HS PRN Joselito Fieldhouse B, MD   50 mg at 01/13/17 2336    Lab Results:  No results found for this or any previous visit (from the past 48 hour(s)).  Blood Alcohol level:  Lab Results  Component Value Date   ETH 235 (H) 01/10/2017   ETH <5 75/17/0017    Metabolic Disorder Labs: Lab Results  Component Value Date   HGBA1C 5.3 07/30/2016   MPG 105 07/30/2016   No results found for: PROLACTIN Lab Results  Component Value Date   CHOL 187 07/30/2016   TRIG 73 07/30/2016    HDL 85 07/30/2016   CHOLHDL 2.2 07/30/2016   VLDL 15 07/30/2016   LDLCALC 87 07/30/2016    Physical Findings: AIMS:  , ,  ,  ,    CIWA:  CIWA-Ar Total: 0 COWS:     Musculoskeletal: Strength & Muscle Tone: within normal limits Gait & Station: normal Patient leans: N/A  Psychiatric Specialty Exam: Physical Exam  Nursing note and vitals reviewed. Psychiatric: His affect is blunt. His speech is delayed. He is slowed and withdrawn. Cognition and memory are normal. He expresses impulsivity. He exhibits a depressed mood. He expresses suicidal ideation. He expresses suicidal plans.  unctional status this is noted that you have to "the nursing station [phone] thank you  Review of Systems  Psychiatric/Behavioral: Positive for depression, substance abuse and suicidal ideas.  All other systems reviewed and are negative.   Blood pressure 103/67, pulse 82, temperature 97.8 F (36.6 C), temperature source Oral, resp. rate 18, height 5' 4"  (1.626 m), weight 66.5 kg (146 lb 8 oz), SpO2 99 %.Body mass index is 25.15 kg/m.  General Appearance: Fairly Groomed  Eye Contact:  Minimal  Speech:  Clear and Coherent  Volume:  Normal  Mood:  Depressed  Affect:  Blunt  Thought Process:  Goal Directed and Descriptions of Associations: Intact  Orientation:  Full (Time, Place, and Person)  Thought Content:  WDL  Suicidal Thoughts:  Yes.  with intent/plan  Homicidal Thoughts:  No  Memory:  Immediate;   Fair Recent;   Fair Remote;   Fair  Judgement:  Poor  Insight:  Lacking  Psychomotor Activity:  Psychomotor Retardation  Concentration:  Concentration: Fair and Attention Span: Fair  Recall:  [employer] of Knowledge:  Fair  Language:  Fair  Akathisia:  No  Handed:  Right  AIMS (if indicated):     Assets:  Communication Skills Desire for Improvement Physical Health Resilience  ADL's:  Intact  Cognition:  WNL  Sleep:  Number of Hours: 4     Treatment Plan Summary: Daily contact with  patient to assess and evaluate symptoms and progress in treatment and Medication management   Mr. Linsley is a 31 year old male with history of bipolar disorder and substance abuse admitted for suicidal ideation in the context of treatment noncompliance and substance use.   1. Suicidal ideation. The patient is able to contract for safety in the hospital.   2. Mood. The patient does not believe medication has ever been helpful.   3. Anxiety. We restarted Minipress for nightmares and flashbacks. Blood pressure is low.   4. Alcohol abuse. He is very sedated from Librium. I will discontinue taper. He reports drinkig "  few beers" daily. Vital signs are stable.  5. Substance abuse treatment. The patient is unsure.    6. Insomnia. We will give Seroquel.    7. Metabolic syndrome monitoring. Lipid panel, TSH and hemoglobin A1c were recently done.    8. EKG. Normal sinus rhythm. QTC 392.   9. Disposition. Homeless shelter. Last time he was discharged to [facility] but never got there. He will follow up with [facility].  Orson Slick, MD 01/14/2017, 6:17 PM

## 2017-01-14 NOTE — Progress Notes (Signed)
Patient ID: Roger [facility], male   DOB: July 29, 1985, 31 y.o.   MRN: [id number] Roger [facility] approached this CSW asking to talk. Requested more information about treatment options. He verbalizes that he doesn't want to go to treatment "far away" or somewhere he will have restrictions or a roommate.  CSW informed him that such a place does not exist at this time or as a possibility in his current circumstances.  CSW encouraged Pt that his goal of being sober would take hard work and would push him outside his comfort zone.  Pt verbalizes understanding and said he would continue to think about it.  Jake SharkSara Pressley Tadesse, LCSW

## 2017-01-14 NOTE — Progress Notes (Deleted)
Patient ID: Roger Hall, male   DOB: Nov 14, 1985, 31 y.o.   MRN: [id number] CSW met with Jolyne Loa, Pt's daughter to discuss current situation. She verbalizes that she is not willing to have Pt live with her and that her father and sister, whom Pt was living with prior to admission, do not want her to return there either.  CSW validated her feelings about current circumstances as she was tearful and felt guilty for not agreeing to allow her mother to move in. CSW informed her that her only other options would be the shelter or arrangements that the Pt makes on her own with other friends or family.  Informed her that Pt had turned down other opportunities for inpatient treatment.  She verbalizes being willing to meet with her mother but after more discussion says she thinks it may not be the best thing because she doesn't want to be "talked into taking her in"  CSW supported her in having healthy boundaries for herself and her family.    Notified Pt of her family's decision.  She was tearful briefly saying she "guessed her marriage was over"  Also expressed frustration with not being supported.  CSW encouraged her to use this as an opportunity to break the pattern of substance abuse she had been in when living at home by using this opportunity to make a change and prove her commitment to her sobriety through her actions.  Pt verbalizes vague statements about how she "would not feel safe in being discharged now that she knows this"  CSW notifed RN Christy of Pt's statements.  Dossie Arbour, LCSW

## 2017-01-14 NOTE — Progress Notes (Signed)
Patient ID: Laurene Footmansaac Antonio Oestreicher, male   DOB: March 01, 1986, 31 y.o.   MRN: [id number] LCSW Group Therapy Note 01/14/2017 9am  Type of Therapy and Topic:  Group Therapy:  Setting Goals  Participation Level:  Did Not Attend  Description of Group: In this process group, patients discussed using strengths to work toward goals and address challenges.  Patients identified two positive things about themselves and one goal they were working on.  Patients were given the opportunity to share openly and support each other's plan for self-empowerment.  The group discussed the value of gratitude and were encouraged to have a daily reflection of positive characteristics or circumstances.  Patients were encouraged to identify a plan to utilize their strengths to work on current challenges and goals.  Therapeutic Goals 1. Patient will verbalize personal strengths/positive qualities and relate how these can assist with achieving desired personal goals 2. Patients will verbalize affirmation of peers plans for personal change and goal setting 3. Patients will explore the value of gratitude and positive focus as related to successful achievement of goals 4. Patients will verbalize a plan for regular reinforcement of personal positive qualities and circumstances.  Summary of Patient Progress:       Therapeutic Modalities Cognitive Behavioral Therapy Motivational Interviewing    Glennon MacSara P Jeison Delpilar, LCSW 01/14/2017 4:15 PM

## 2017-01-14 NOTE — [facility] Group Notes (Signed)
[facility] Group Notes:  (Nursing/MHT/Case Management/Adjunct)  Date:  01/14/2017  Time:  9:36 PM  Type of Therapy:  Group Therapy  Participation Level:  Did Not Attend  Participation Quality:Summary of Progress/Problems:  Roger Hall 01/14/2017, 9:36 PM

## 2017-01-15 MED ORDER — TRAZODONE HCL 100 MG PO TABS: 100.0000 mg | ORAL_TABLET | Freq: Every evening | ORAL | Status: DC | PRN

## 2017-01-15 MED ORDER — ARIPIPRAZOLE 10 MG PO TABS: 10.0000 mg | ORAL_TABLET | Freq: Every day | ORAL | 1 refills | Status: AC

## 2017-01-15 MED ORDER — OXCARBAZEPINE 300 MG PO TABS: 300.0000 mg | ORAL_TABLET | Freq: Two times a day (BID) | ORAL | 1 refills | Status: AC

## 2017-01-15 MED ORDER — TRAZODONE HCL 100 MG PO TABS: 100.0000 mg | ORAL_TABLET | Freq: Every day | ORAL | 1 refills | Status: AC

## 2017-01-15 MED ORDER — ARIPIPRAZOLE 10 MG PO TABS: 10.0000 mg | ORAL_TABLET | Freq: Every day | ORAL | 1 refills | Status: DC

## 2017-01-15 MED ORDER — OXCARBAZEPINE 300 MG PO TABS: 300.0000 mg | ORAL_TABLET | Freq: Two times a day (BID) | ORAL | 1 refills | Status: DC

## 2017-01-15 MED ORDER — ARIPIPRAZOLE 10 MG PO TABS
10.0000 mg | ORAL_TABLET | Freq: Every day | ORAL | Status: DC
Start: 2017-01-15 — End: 2017-01-16
  Administered 2017-01-15 – 2017-01-16 (×2): 10 mg via ORAL
  Filled 2017-01-15 (×2): qty 1

## 2017-01-15 MED ORDER — OXCARBAZEPINE 300 MG PO TABS
300.0000 mg | ORAL_TABLET | Freq: Two times a day (BID) | ORAL | Status: DC
  Administered 2017-01-15 – 2017-01-16 (×2): 300 mg via ORAL
  Filled 2017-01-15 (×2): qty 1

## 2017-01-15 MED ORDER — TRAZODONE HCL 100 MG PO TABS: 100.0000 mg | ORAL_TABLET | Freq: Every day | ORAL | 1 refills | Status: DC

## 2017-01-15 NOTE — [facility] Group Notes (Signed)
[facility] Group Notes:  (Nursing/MHT/Case Management/Adjunct)  Date:  01/15/2017  Time:  2:31 PM  Type of Therapy:  Psychoeducational Skills  Participation Level:  Did Not Attend  Jillianne Gamino A Maile Linford 01/15/2017, 2:31 PM

## 2017-01-15 NOTE — Progress Notes (Signed)
D: Pt denies SI/HI/AVH. Pt is pleasant and cooperative., affect is flat. Patient isolates to room, minimal interaction with staff and peers. Patent thoughts are organized no bizarre behavior noted during the shift.  Pt was offered support and encouragement. Pt was given scheduled medications. Pt was encouraged to attend groups. Q 15 minute checks were done for safety.  R:Pt did notattends group.Pt is taking medication. Pt has no complaints.Pt receptive to treatment and safety maintained on unit.

## 2017-01-15 NOTE — [facility] Group Notes (Signed)
  [facility] LCSW Group Therapy Note  Date/Time:  01/15/17, 0930  Type of Therapy/Topic:  Group Therapy:  Emotion Regulation  Participation Level:  Did Not Attend   Mood:  Description of Group:    The purpose of this group is to assist patients in learning to regulate negative emotions and experience positive emotions. Patients will be guided to discuss ways in which they have been vulnerable to their negative emotions. These vulnerabilities will be juxtaposed with experiences of positive emotions or situations, and patients challenged to use positive emotions to combat negative ones. Special emphasis will be placed on coping with negative emotions in conflict situations, and patients will process healthy conflict resolution skills.  Therapeutic Goals: 1. Patient will identify two positive emotions or experiences to reflect on in order to balance out negative emotions:  2. Patient will label two or more emotions that they find the most difficult to experience:  3. Patient will be able to demonstrate positive conflict resolution skills through discussion or role plays:   Summary of Patient Progress:       Therapeutic Modalities:   Cognitive Behavioral Therapy Feelings Identification Dialectical Behavioral Therapy  Greg Kai Railsback, LCSW 

## 2017-01-15 NOTE — [facility] Suicide Risk Assessment (Signed)
[facility]   Principal Problem: Bipolar I disorder, most recent episode depressed, severe without psychotic features [facility](HCC) Discharge Diagnoses:  Patient Active Problem List   Diagnosis Date Noted  . 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 Tobacco use disorder [F17.200] 07/29/2016  . PTSD (post-traumatic stress disorder) [F43.10] 07/29/2016  . Alcohol use disorder, moderate, dependence (HCC) [F10.20] 07/29/2016  . Bipolar I disorder, most recent episode depressed, severe without psychotic features (HCC) [F31.4] 07/29/2016    Total Time spent with patient: 30 minutes  Musculoskeletal: Strength & Muscle Tone: within normal limits Gait & Station: normal Patient leans: N/A  Psychiatric Specialty Exam: Review of Systems  Psychiatric/Behavioral: Positive for substance abuse.  All other systems reviewed and are negative.   Blood pressure 108/73, pulse 63, temperature 97.9 F (36.6 C), temperature source Oral, resp. rate 18, height 5\' 4"  (1.626 m), weight 66.5 kg (146 lb 8 oz), SpO2 100 %.Body mass index is 25.15 kg/m.  General Appearance: Casual  Eye Contact::  Good  Speech:  Clear and Coherent409  Volume:  Normal  Mood:  Anxious  Affect:  Blunt  Thought Process:  Goal Directed and Descriptions of Associations: Intact  Orientation:  Full (Time, Place, and Person)  Thought Content:  WDL  Suicidal Thoughts:  No  Homicidal Thoughts:  No  Memory:  Immediate;   Fair Recent;   Fair Remote;   Fair  Judgement:  Poor  Insight:  Lacking  Psychomotor Activity:  Normal  Concentration:  Fair  Recall:  [employer]  Fund of Knowledge:Fair  Language: Fair  Akathisia:  No  Handed:  Right  AIMS (if indicated):     Assets:  Communication Skills Desire for Improvement Physical Health Resilience  Sleep:  Number of Hours: 5.15  Cognition: WNL  ADL's:  Intact   Mental Status Per Nursing Assessment::   On Admission:  NA  Demographic Factors:  Male, Low  socioeconomic status and Unemployed  Loss Factors: Decrease in vocational status and Financial problems/change in socioeconomic status  Historical Factors: Prior suicide attempts, Family history of mental illness or substance abuse and Impulsivity  Risk Reduction Factors:   Responsible for children under 118 years of age, Sense of responsibility to family and Positive social support  Continued Clinical Symptoms:  Bipolar Disorder:   Depressive phase Depression:   Comorbid alcohol abuse/dependence Impulsivity Alcohol/Substance Abuse/Dependencies  Cognitive Features That Contribute To Risk:  None    Suicide Risk:  Minimal: No identifiable suicidal ideation.  Patients presenting with no risk factors but with morbid ruminations; may be classified as minimal risk based on the severity of the depressive symptoms    Plan Of Care/Follow-up recommendations:  Activity:  As tolerated. Diet:  Regular. Other:  Keep follow-up appointments.  Kristine LineaJolanta Annye Forrey, MD 01/15/2017, 3:10 PM

## 2017-01-15 NOTE — Progress Notes (Signed)
Patient isolative to self and room. Minimal interaction with staff and peers. Forwards little. Encouraged patient to verbalize feelings and to attend group. Patient continues to isolate to self.

## 2017-01-15 NOTE — Progress Notes (Signed)
[facility] MD Progress Note  01/15/2017 3:14 PM Roger Hall  MRN:  [id number]  Subjective:   01/13/2017. Roger Hall met with treatment team today but was sleepy and unable to fully participate. He wants help for "anxiety, depressiion, drugs, everything". He was unwilling to meet with Roger Hall, [facility] representative today. Has no somatic complaints.  01/14/2017. Roger Hall for his anything about today. He complains of depression, anxiety, poor energy. He is not however interested in substance abuse treatment. [facility] [place]  01/15/2017. The patient has not been engaging with me or SW. He shows no initiative in discharge planning. Her rejected our [facility] of [facility]. He does not want to go to [facility]. He dislikes Seroquel and needs something for anxiety. No somatic complaints. No program participation. Discharge tomorrow.  Per nursing: Patient is safe and secure no injury to selfe and others monitored every 15 minutes by Roger team  Principal Problem: Bipolar I disorder, most recent episode depressed, severe without psychotic features [facility]) Diagnosis:   Patient Active Problem List   Diagnosis Date Noted  . 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 Tobacco use disorder [F17.200] 07/29/2016  . PTSD (post-traumatic stress disorder) [F43.10] 07/29/2016  . Alcohol use disorder, moderate, dependence ([place]) [F10.20] 07/29/2016  . Bipolar I disorder, most recent episode depressed, severe without psychotic features ([place]) [F31.4] 07/29/2016   Total Time spent with patient: 30 minutes  Past Psychiatric History: bipolar disorder, substance abuse.  Past Medical History:  Past Medical History:  Diagnosis Date  . Asthma   . Depression    History reviewed. No pertinent surgical history. Family History:  Family History  Problem Relation Age of Onset  . Depression Mother    Family Psychiatric  History:  see H&P. Social History:  History  Alcohol Use  . 3.6 oz/week  . 6 Cans of beer per week    Comment: daily     History  Drug use: Unknown    Social History   Social History  . Marital status: Single    Spouse name: N/A  . Number of children: N/A  . Years of education: N/A   Social History Main Topics  . Smoking status: Current Every Day Smoker    Packs/day: 0.50    Years: 10.00    Types: Cigarettes  . Smokeless tobacco: Never Used  . Alcohol use 3.6 oz/week    6 Cans of beer per week     Comment: daily  . Drug use: Unknown  . Sexual activity: Not Currently   Other Topics Concern  . None   Social History Narrative  . None   Additional Social History:                         Sleep: Poor  Appetite:  Fair  Current Medications: Current Facility-Administered Medications  Medication Dose Route Frequency Provider Last Rate Last Dose  . acetaminophen (TYLENOL) tablet 650 mg  650 mg Oral Q6H PRN Pucilowska, Jolanta B, MD      . albuterol (PROVENTIL) (2.5 MG/3ML) 0.083% nebulizer solution 3 mL  3 mL Inhalation Q6H PRN Pucilowska, Jolanta B, MD      . alum & mag hydroxide-simeth (MAALOX/MYLANTA) 200-200-20 MG/5ML suspension 30 mL  30 mL Oral Q4H PRN Pucilowska, Jolanta B, MD      . ARIPiprazole (ABILIFY) tablet 10 mg  10 mg Oral Daily Pucilowska, Jolanta  B, MD      . feeding supplement (ENSURE ENLIVE) (ENSURE ENLIVE) liquid 237 mL  237 mL Oral BID BM Pucilowska, Jolanta B, MD   237 mL at 01/15/17 1400  . hydrOXYzine (ATARAX/VISTARIL) tablet 50 mg  50 mg Oral TID PRN Pucilowska, Jolanta B, MD   50 mg at 01/12/17 2144  . magnesium hydroxide (MILK OF MAGNESIA) suspension 30 mL  30 mL Oral Daily PRN Pucilowska, Jolanta B, MD      . nicotine (NICODERM CQ - dosed in mg/24 hours) patch 21 mg  21 mg Transdermal Q0600 Pucilowska, Jolanta B, MD   21 mg at 01/14/17 0626  . Oxcarbazepine (TRILEPTAL) tablet 300 mg  300 mg Oral BID Pucilowska, Jolanta B, MD      . traZODone  (DESYREL) tablet 100 mg  100 mg Oral QHS PRN Pucilowska, Jolanta B, MD        Lab Results:  No results found for this or any previous visit (from the past 48 hour(s)).  Blood Alcohol level:  Lab Results  Component Value Date   ETH 235 (H) 01/10/2017   ETH <5 41/93/7902    Metabolic Disorder Labs: Lab Results  Component Value Date   HGBA1C 5.3 07/30/2016   MPG 105 07/30/2016   No results found for: PROLACTIN Lab Results  Component Value Date   CHOL 187 07/30/2016   TRIG 73 07/30/2016   HDL 85 07/30/2016   CHOLHDL 2.2 07/30/2016   VLDL 15 07/30/2016   LDLCALC 87 07/30/2016    Physical Findings: AIMS:  , ,  ,  ,    CIWA:  CIWA-Ar Total: 0 COWS:     Musculoskeletal: Strength & Muscle Tone: within normal limits Gait & Station: normal Patient leans: N/A  Psychiatric Specialty Exam: Physical Exam  Nursing note and vitals reviewed. Psychiatric: His affect is blunt. His speech is delayed. He is slowed and withdrawn. Cognition and memory are normal. He expresses impulsivity. He exhibits a depressed mood. He expresses suicidal ideation. He expresses suicidal plans.  unctional status this is noted that you have to "the nursing station [phone] thank you  Review of Systems  Psychiatric/Behavioral: Positive for depression, substance abuse and suicidal ideas.  All other systems reviewed and are negative.   Blood pressure 108/73, pulse 63, temperature 97.9 F (36.6 C), temperature source Oral, resp. rate 18, height _0  (1.626 m), weight 66.5 kg (146 lb 8 oz), SpO2 100 %.Body mass index is 25.15 kg/m.  General Appearance: Fairly Groomed  Eye Contact:  Minimal  Speech:  Clear and Coherent  Volume:  Normal  Mood:  Depressed  Affect:  Blunt  Thought Process:  Goal Directed and Descriptions of Associations: Intact  Orientation:  Full (Time, Place, and Person)  Thought Content:  WDL  Suicidal Thoughts:  Yes.  with intent/plan  Homicidal Thoughts:  No  Memory:  Immediate;    Fair Recent;   Fair Remote;   Fair  Judgement:  Poor  Insight:  Lacking  Psychomotor Activity:  Psychomotor Retardation  Concentration:  Concentration: Fair and Attention Span: Fair  Recall:  [employer] of Knowledge:  Fair  Language:  Fair  Akathisia:  No  Handed:  Right  AIMS (if indicated):     Assets:  Communication Skills Desire for Improvement Physical Health Resilience  ADL's:  Intact  Cognition:  WNL  Sleep:  Number of Hours: 5.15     Treatment Plan Summary: Daily contact with patient to assess and evaluate symptoms and  progress in treatment and Medication management   Mr. Alcantar is a 31 year old male with history of bipolar disorder and substance abuse admitted for suicidal ideation in the context of treatment noncompliance and substance use.   1. Suicidal ideation. The patient is able to contract for safety in the hospital.   2. Mood. Stop Seroquel, restart Abilify and Trileptal.    3. Anxiety. He used to be on Minipress but blood pressure is low.   4. Alcohol abuse. He is very sedated from Librium. I will discontinue taper. He reports drinkig "few beers" daily. Vital signs are stable.  5. Substance abuse treatment. The patient declines.    6. Insomnia. Trazodone is available.     7. Metabolic syndrome monitoring. Lipid panel, TSH and hemoglobin A1c were recently done.    8. EKG. Normal sinus rhythm. QTC 392.   9. Disposition. Homeless shelter. He will follow up with [facility].  Orson Slick, MD 01/15/2017, 3:14 PM

## 2017-01-15 NOTE — Plan of Care (Signed)
Problem: Safety: Goal: Ability to remain free from injury will improve Outcome: Progressing No injury reported or observed   

## 2017-01-16 NOTE — Plan of Care (Signed)
  Problem: Coping: Goal: Ability to verbalize frustrations and anger appropriately will improve Outcome: Progressing  Patient verbalized frustrations to staff.  

## 2017-01-16 NOTE — BHH Suicide Risk Assessment (Signed)
[facility]   Principal Problem: Bipolar I disorder, most recent episode depressed, severe without psychotic features [facility](HCC) Discharge Diagnoses:  Patient Active Problem List   Diagnosis Date Noted  . 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 Tobacco use disorder [F17.200] 07/29/2016  . PTSD (post-traumatic stress disorder) [F43.10] 07/29/2016  . Alcohol use disorder, moderate, dependence (HCC) [F10.20] 07/29/2016  . Bipolar I disorder, most recent episode depressed, severe without psychotic features (HCC) [F31.4] 07/29/2016    Total Time spent with patient: 30 minutes  Musculoskeletal: Strength & Muscle Tone: within normal limits Gait & Station: normal Patient leans: N/A  Psychiatric Specialty Exam: Review of Systems  Psychiatric/Behavioral: Positive for substance abuse.  All other systems reviewed and are negative.   Blood pressure 111/65, pulse 66, temperature 98 F (36.7 C), resp. rate 18, height 5\' 4"  (1.626 m), weight 66.5 kg (146 lb 8 oz), SpO2 100 %.Body mass index is 25.15 kg/m.  General Appearance: Casual  Eye Contact::  Good  Speech:  Clear and Coherent409  Volume:  Normal  Mood:  Anxious  Affect:  Blunt  Thought Process:  Goal Directed and Descriptions of Associations: Intact  Orientation:  Full (Time, Place, and Person)  Thought Content:  WDL  Suicidal Thoughts:  No  Homicidal Thoughts:  No  Memory:  Immediate;   Fair Recent;   Fair Remote;   Fair  Judgement:  Poor  Insight:  Lacking  Psychomotor Activity:  Normal  Concentration:  Fair  Recall:  [employer]Fair  Fund of Knowledge:Fair  Language: Fair  Akathisia:  No  Handed:  Right  AIMS (if indicated):     Assets:  Communication Skills Desire for Improvement Physical Health Resilience  Sleep:  Number of Hours: 8  Cognition: WNL  ADL's:  Intact   Mental Status Per Nursing Assessment::   On Admission:  NA  Demographic Factors:  Male, Low socioeconomic status and  Unemployed  Loss Factors: Decrease in vocational status and Financial problems/change in socioeconomic status  Historical Factors: Prior suicide attempts, Family history of mental illness or substance abuse and Impulsivity  Risk Reduction Factors:   Responsible for children under 29 years of age, Sense of responsibility to family and Positive social support  Continued Clinical Symptoms:  Bipolar Disorder:   Depressive phase Depression:   Comorbid alcohol abuse/dependence Impulsivity Alcohol/Substance Abuse/Dependencies  Cognitive Features That Contribute To Risk:  None    Suicide Risk:  Minimal: No identifiable suicidal ideation.  Patients presenting with no risk factors but with morbid ruminations; may be classified as minimal risk based on the severity of the depressive symptoms    Plan Of Care/Follow-up recommendations:  Activity:  As tolerated. Diet:  Regular. Other:  Keep follow-up appointments.  Kristine LineaJolanta Consuelo Suthers, MD 01/16/2017, 9:27 AM

## 2017-01-16 NOTE — Progress Notes (Signed)
Patient ID: Roger Hall, male   DOB: [birth year], 31 y.o.   MRN: [id number] LCSW Group Therapy Note 01/16/2017 9am  Type of Therapy and Topic:  Group Therapy:  Setting Goals  Participation Level:  Did Not Attend  Description of Group: In this process group, patients discussed using strengths to work toward goals and address challenges.  Patients identified two positive things about themselves and one goal they were working on.  Patients were given the opportunity to share openly and support each other's plan for self-empowerment.  The group discussed the value of gratitude and were encouraged to have a daily reflection of positive characteristics or circumstances.  Patients were encouraged to identify a plan to utilize their strengths to work on current challenges and goals.  Therapeutic Goals 1. Patient will verbalize personal strengths/positive qualities and relate how these can assist with achieving desired personal goals 2. Patients will verbalize affirmation of peers plans for personal change and goal setting 3. Patients will explore the value of gratitude and positive focus as related to successful achievement of goals 4. Patients will verbalize a plan for regular reinforcement of personal positive qualities and circumstances.  Summary of Patient Progress:       Therapeutic Modalities Cognitive Behavioral Therapy Motivational Interviewing    Glennon MacSara P Dariah Mcsorley, LCSW 01/16/2017 11:24 AM

## 2017-01-16 NOTE — Discharge Summary (Signed)
Physician Discharge Summary Note  Patient:  Roger Hall is an 31 y.o., male MRN:  [id number] DOB:  11/21/1985 Patient phone:  [phone] (home)  Patient address:   [address] [place] [address],  Total Time spent with patient: 30 minutes  Date of Admission:  01/11/2017 Date of Discharge: 01/16/2017  Reason for Admission:  Suicidal ideation.  Identifying data. Mr. Roger Pettifordis a 31 year old malewith a history of bipolar disorder and substance abuse.  Chief complaint. "Depression."  History of present illness. Information was obtained from the patient and the chart. The patient came to the emergency room complaining of worsening of depression and suicidal ideation. He was hospitalized at [facility] 2 months ago and discharged to [facility] but he never arrived there. Instead he stayed with his family and kept a job until 2 days ago when he had falling out with his family. He became homeless and came to the emergency room. The patient did not continue on his medications following discharge. He immediately relapsed on alcohol and has been drinking "a few beers" every day. In addition he's been using stimulants. He is positive for cocaine and amphetamines. He reports all symptoms of depression with poor sleep, decreased appetite, anhedonia, guilt and hopelessness worthlessness, poor energy and concentration, social isolation, crying spells. This culminated in suicidal thinking.. It is worth noting that in the emergency room the patient oriented and denies suicidal ideation. He was evaluated by an elevated psychiatrist at admission was recommended. The patient denies psychotic symptoms or symptoms suggestive of bipolar mania. He endorses high anxiety. He was diagnosed with PTSD before.  Past psychiatric history. The patient has 5 prior psychiatric hospitalizations. He's been tried on numerous medications including lithium and Depakote. Lithium caused  tremors and Depakote weight gain and they are both unacceptable. There were several suicide attempts including hanging. 3 years ago he was in substance use treatment program. He relapsed on substances almost immediately.   Family psychiatric history. Mother with bipolar disorder.  Social history. He is homeless, jobless and uninsured.   Principal Problem: Bipolar I disorder, most recent episode depressed, severe without psychotic features [facility]) Discharge Diagnoses: Patient Active Problem List   Diagnosis Date Noted  . 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 Tobacco use disorder [F17.200] 07/29/2016  . PTSD (post-traumatic stress disorder) [F43.10] 07/29/2016  . Alcohol use disorder, moderate, dependence (HCC) [F10.20] 07/29/2016  . Bipolar I disorder, most recent episode depressed, severe without psychotic features (HCC) [F31.4] 07/29/2016    Past Medical History:  Past Medical History:  Diagnosis Date  . Asthma   . Depression    History reviewed. No pertinent surgical history. Family History:  Family History  Problem Relation Age of Onset  . Depression Mother    Social History:  History  Alcohol Use  . 3.6 oz/week  . 6 Cans of beer per week    Comment: daily     History  Drug use: Unknown    Social History   Social History  . Marital status: Single    Spouse name: N/A  . Number of children: N/A  . Years of education: N/A   Social History Main Topics  . Smoking status: Current Every Day Smoker    Packs/day: 0.50    Years: 10.00    Types: Cigarettes  . Smokeless tobacco: Never Used  . Alcohol use 3.6 oz/week    6 Cans of beer per week     Comment: daily  .  Drug use: Unknown  . Sexual activity: Not Currently   Other Topics Concern  . None   Social History Narrative  . None    Hospital Course:    [name] is a 31 year old male with a history of bipolar disorder and substance abuse admitted for suicidal ideation in the context of treatment  noncompliance and substance use.   1. Suicidal ideation. Resolved. The patient is able to contract for safety. He is forward thinking and optimistic about the future.   2. Mood. The patient does not believe medication has ever been helpful but agreed to restart Abilify and Trileptal for mood stabilization.  3. Anxiety. We did not restart Minipress for nightmares and flashbacks. Blood pressure is low.   4. Alcohol abuse. He completed Librium taper. Vital signs are stable.  5. Substance abuse treatment. The patient [facility] but refused treatment.    6. Metabolic syndrome monitoring. Lipid panel, TSH and hemoglobin A1c were recently done.   7. EKG. Normal sinus rhythm. QTC 392.   8. Smoking. Nicotine patch was available. Smoking cessation counseling was offered.  9. Disposition. He was discharged to his car. He has contact information with [facility] for SA IOP and medication management.   Physical Findings: AIMS:  , ,  ,  ,    CIWA:  CIWA-Ar Total: 0 COWS:     Musculoskeletal: Strength & Muscle Tone: within normal limits Gait & Station: normal Patient leans: N/A  Psychiatric Specialty Exam: Physical Exam  Nursing note and vitals reviewed. Psychiatric: His speech is normal and behavior is normal. Thought content normal. His mood appears anxious. Cognition and memory are normal. He expresses impulsivity.    Review of Systems  Psychiatric/Behavioral: Positive for substance abuse.  All other systems reviewed and are negative.   Blood pressure 111/65, pulse 66, temperature 98 F (36.7 C), resp. rate 18, height 5\' 4"  (1.626 m), weight 66.5 kg (146 lb 8 oz), SpO2 100 %.Body mass index is 25.15 kg/m.  General Appearance: Casual  Eye Contact:  Good  Speech:  Clear and Coherent  Volume:  Normal  Mood:  Anxious  Affect:  Blunt  Thought Process:  Goal Directed and Descriptions of Associations: Intact  Orientation:  Full (Time, Place, and Person)  Thought  Content:  WDL  Suicidal Thoughts:  No  Homicidal Thoughts:  No  Memory:  Immediate;   Fair Recent;   Fair Remote;   Fair  Judgement:  Poor  Insight:  Lacking  Psychomotor Activity:  Normal  Concentration:  Concentration: Fair and Attention Span: Fair  Recall:  [employer]Fair  Fund of Knowledge:  Fair  Language:  Fair  Akathisia:  No  Handed:  Right  AIMS (if indicated):     Assets:  Communication Skills Desire for Improvement Physical Health Resilience  ADL's:  Intact  Cognition:  WNL  Sleep:  Number of Hours: 8     Have you used any form of tobacco in the last 30 days? (Cigarettes, Smokeless Tobacco, Cigars, and/or Pipes): Yes  Has this patient used any form of tobacco in the last 30 days? (Cigarettes, Smokeless Tobacco, Cigars, and/or Pipes) Yes, Yes, A prescription for an FDA-approved tobacco cessation medication was offered at discharge and the patient refused  Blood Alcohol level:  Lab Results  Component Value Date   ETH 235 (H) 01/10/2017   ETH <5 07/28/2016    Metabolic Disorder Labs:  Lab Results  Component Value Date   HGBA1C 5.3 07/30/2016  MPG 105 07/30/2016   No results found for: PROLACTIN Lab Results  Component Value Date   CHOL 187 07/30/2016   TRIG 73 07/30/2016   HDL 85 07/30/2016   CHOLHDL 2.2 07/30/2016   VLDL 15 07/30/2016   LDLCALC 87 07/30/2016    See Psychiatric Specialty Exam and Suicide Risk Assessment completed by Attending Physician prior to discharge.  Discharge destination:  Home  Is patient on multiple antipsychotic therapies at discharge:  No   Has Patient had three or more failed trials of antipsychotic monotherapy by history:  No  Recommended Plan for Multiple Antipsychotic Therapies: NA  Discharge Instructions    Diet - low sodium heart healthy    Complete by:  As directed    Increase activity slowly    Complete by:  As directed      Allergies as of 01/16/2017   No Known Allergies     Medication List    STOP taking  these medications   azithromycin 250 MG tablet Commonly known as:  ZITHROMAX Z-PAK   HYDROcodone-acetaminophen 5-325 MG tablet Commonly known as:  NORCO/VICODIN   ibuprofen 600 MG tablet Commonly known as:  ADVIL,MOTRIN   ondansetron 8 MG tablet Commonly known as:  ZOFRAN   prazosin 1 MG capsule Commonly known as:  MINIPRESS   predniSONE 10 MG tablet Commonly known as:  DELTASONE   QUEtiapine 50 MG tablet Commonly known as:  SEROQUEL     TAKE these medications     Indication  albuterol 108 (90 Base) MCG/ACT inhaler Commonly known as:  PROVENTIL HFA;VENTOLIN HFA Inhale 2 puffs into the lungs every 6 (six) hours as needed for wheezing or shortness of breath.  Indication:  Asthma   ARIPiprazole 10 MG tablet Commonly known as:  ABILIFY Take 1 tablet (10 mg total) by mouth daily. What changed:  medication strength  how much to take  Indication:  Mixed Bipolar Affective Disorder   Oxcarbazepine 300 MG tablet Commonly known as:  TRILEPTAL Take 1 tablet (300 mg total) by mouth 2 (two) times daily.  Indication:  bipolar disorder   traZODone 100 MG tablet Commonly known as:  DESYREL Take 1 tablet (100 mg total) by mouth at bedtime.  Indication:  Trouble Sleeping        Follow-up recommendations:  Activity:  as tolerated. Diet:  regular. Other:  keep follow up appointments.  Comments:    Signed: Kristine Linea, MD 01/16/2017, 9:28 AM

## 2017-01-16 NOTE — Progress Notes (Signed)
Multiple attempts were made to engage this pt in discharge planning in order to arrange substance abuse treatment.  Pt was offered options of [facility], [facility], [facility].  [facility] did have bed availability.  [facility] reported they would have bed availability on both 9/13 and 9/14.  [facility] reported no current beds but pt could still come and would be given a place to stay until a bed became available.  Also investigated was [facility], which did not have any openings currently.  On 01/15/17, CSW informed pt of planned discharge and asked pt if he had decided to accept any of these options.  [facility] to [facility] and was non-committal about going there.  On 01/16/17, CSW and Unk PintoHarvey Bryant or [facility] talked with pt about medication management [facility], which pt agreed to.  However, pt then indicated that he may be staying in [place] with a cousin or [place] [facility] and stay with his mother.  Follow up with Lorella NimrodHarvey was arranged and pt agreed to contact him if plans changed. Garner NashGregory Pardeep Pautz, MSW, LCSW Clinical Social Worker 01/16/2017 10:00 AM

## 2017-01-16 NOTE — Progress Notes (Signed)
  [facility] :  Will you be returning to the same living situation after discharge:  No. Pt would not commit to a plan: reports he has several options: mother, cousin. At discharge, do you have transportation home?: Yes,  own vehicle Do you have the ability to pay for your medications: No. Medication Management referral made.  Release of information consent forms completed and in the chart;  Patient's signature needed at discharge.  Patient to Follow up at: Follow-up Information    [facility] Go on 01/20/2017.   Why:  Please meet Unk PintoHarvey Bryant, Peer Support Services, at [facility] on Monday, 01/20/17, at 7:00am for your follow up appointment.  Please bring a copy of your hospital discharge paperwork. Contact information: [address] [place] [address] [phone]           Next level of care provider has access to [facility] Link:no  Safety Planning and Suicide Prevention discussed: No.Pt refused consent.  Have you used any form of tobacco in the last 30 days? (Cigarettes, Smokeless Tobacco, Cigars, and/or Pipes): Yes  Has patient been referred to the Quitline?: Patient refused referral  Patient has been referred for addiction treatment: Yes. Copntact info given for various programs.  Lorri FrederickWierda, Dene Nazir Jon, LCSW 01/16/2017, 12:38 PM

## 2017-01-16 NOTE — Progress Notes (Signed)
Denies SI/HI/AVH.   Affect flat. Discharge instructions given, verbalized understanding.  Prescriptions and seven day supply given.  Personal belongings returned.  Escorted off unit by this writer to ED entrance to get his car to travel home.

## 2017-01-16 NOTE — BHH Group Notes (Signed)
BHH LCSW Group Therapy Note  Date/Time: 01/16/17, 1300  Type of Therapy/Topic:  Group Therapy:  Balance in Life  Participation Level:  active  Description of Group:    This group will address the concept of balance and how it feels and looks when one is unbalanced. Patients will be encouraged to process areas in their lives that are out of balance, and identify reasons for remaining unbalanced. Facilitators will guide patients utilizing problem- solving interventions to address and correct the stressor making their life unbalanced. Understanding and applying boundaries will be explored and addressed for obtaining  and maintaining a balanced life. Patients will be encouraged to explore ways to assertively make their unbalanced needs known to significant others in their lives, using other group members and facilitator for support and feedback.  Therapeutic Goals: 1. Patient will identify two or more emotions or situations they have that consume much of in their lives. 2. Patient will identify signs/triggers that life has become out of balance:  3. Patient will identify two ways to set boundaries in order to achieve balance in their lives:  4. Patient will demonstrate ability to communicate their needs through discussion and/or role plays  Summary of Patient Progress: Pt attended first group with this CSW and was more animated than he has been the entire time of admission.  Pt identified family as out of balance and shared that he is responsible for transporting multiple family members.  Pt participated in group discussion.  Very good participation.          Therapeutic Modalities:   Cognitive Behavioral Therapy Solution-Focused Therapy Assertiveness Training  Daleen SquibbGreg Lawson [place], [place]

## 2017-01-16 NOTE — Progress Notes (Signed)
Patient ID: Roger Footmansaac Antonio Hall, male   DOB: 02-22-1986, 31 y.o.   MRN: [id number] LCSW Group Therapy Note  01/16/2017 9am  Type of Therapy/Topic:  Group Therapy:  Balance in Life  Participation Level:  Active  Description of Group:    This group will address the concept of balance and how it feels and looks when one is unbalanced. Patients will be encouraged to process areas in their lives that are out of balance and identify reasons for remaining unbalanced. Facilitators will guide patients in utilizing problem-solving interventions to address and correct the stressor making their life unbalanced. Understanding and applying boundaries will be explored and addressed for obtaining and maintaining a balanced life. Patients will be encouraged to explore ways to assertively make their unbalanced needs known to significant others in their lives, using other group members and facilitator for support and feedback.  Therapeutic Goals: 1. Patient will identify two or more emotions or situations they have that consume much of in their lives. 2. Patient will identify signs/triggers that life has become out of balance:  3. Patient will identify two ways to set boundaries in order to achieve balance in their lives:  4. Patient will demonstrate ability to communicate their needs through discussion and/or role plays  Summary of Patient Progress:  Pt able to meet therapeutic goals listed above.  Continues to present flat affect at times although he was able to participate in using some humor in group.    Therapeutic Modalities:   Cognitive Behavioral Therapy Solution-Focused Therapy Assertiveness Training  Roger MacSara P Melodie Ashworth, LCSW 01/16/2017 11:25 AM

## 2017-01-16 NOTE — Progress Notes (Signed)
D: Pt is alert and oriented x 4, denies SI/HI/AVH. Pt is pleasant and cooperative,  Pt appears less anxious and he is interacting with peers and staff appropriately.  A: Pt was offered support and encouragement. Pt was given scheduled medications. Pt was encouraged to attend groups. Q 15 minute checks were done for safety.  R:Pt attends groups and interacts well with peers and staff. Pt is taking medication. Pt has no complaints.Pt receptive to treatment and safety maintained on unit.

## 2017-01-16 NOTE — Plan of Care (Signed)
Problem: Safety: Goal: Periods of time without injury will increase Outcome: Progressing Remains safe on the unit.  No falls.

## 2017-03-19 ENCOUNTER — Telehealth: Payer: Self-pay | Admitting: Pharmacy Technician

## 2017-03-19 NOTE — Telephone Encounter (Signed)
[facility] filled initial prescription.  Patient was given new patient packet.  Patient never returned information or scheduled and eligibility appointment.  [facility] unable to provide additional medication assistance until eligibility is determined.  Roger Hall J. Marcina Kinnison Care Manager [facility] 

## 2017-05-25 IMAGING — CR DG FINGER THUMB 2+V*R*
1 series · 3 of 3 positions shown · non-contrast
Comparison: None.

CLINICAL DATA: Patient with injury to the thumb. Initial encounter.

EXAM:
RIGHT THUMB 2+V

[Series 1: dg finger thumb right · 0.14mm/px · 3 of 3 slices shown]
[im 1/3]
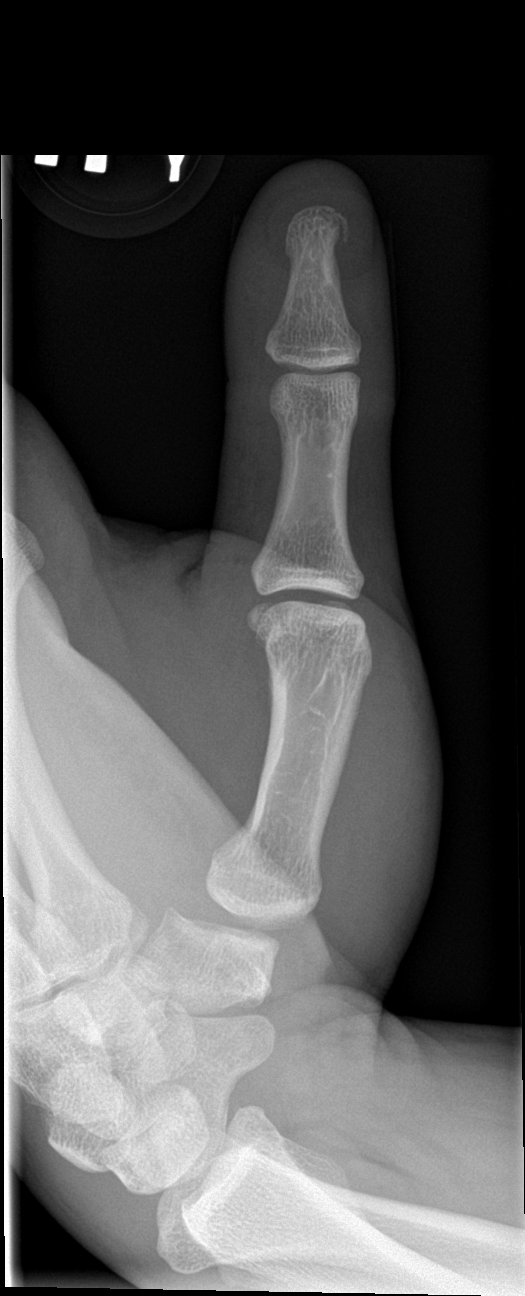
[im 2/3]
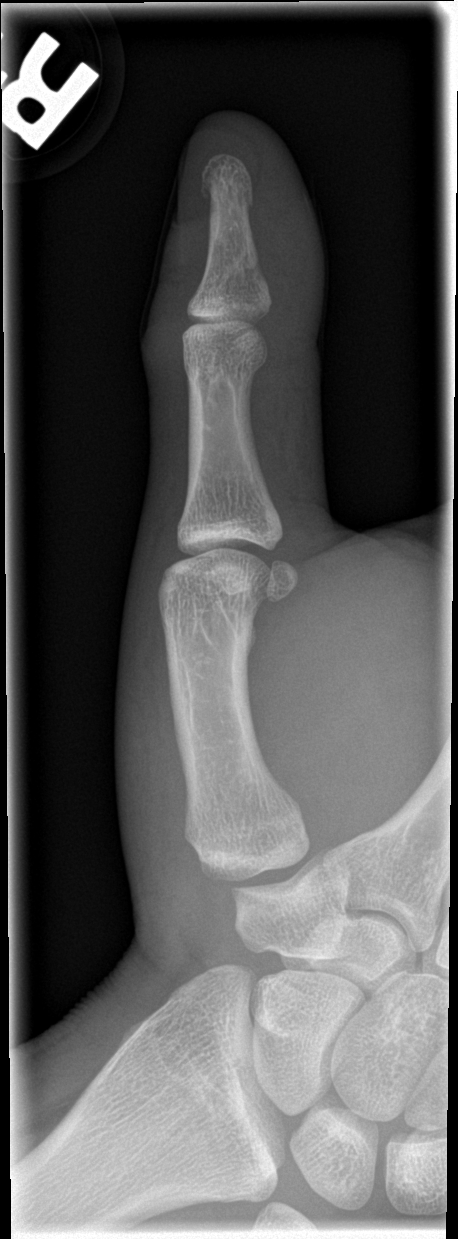
[im 3/3]
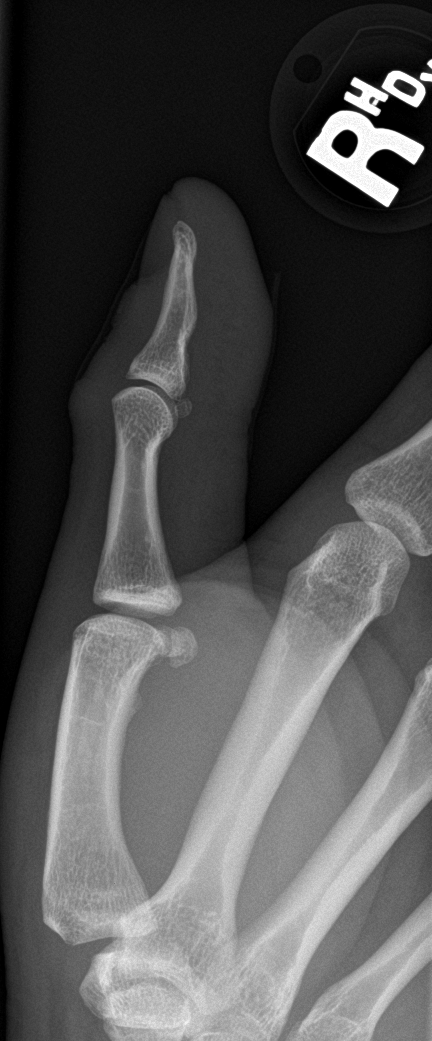

[3 of 3 positions shown; findings below may reference images not displayed]

FINDINGS: There is a crescentic density about the distal aspect of the distal
phalanx of the thumb. Overlying soft tissue swelling. Remainder of
the exam is unremarkable.
IMPRESSION: Small crescentic calcific density compatible with fracture of the
distal aspect of the distal phalanx of the thumb.

## 2017-08-10 IMAGING — CR DG CHEST 2V
1 series · 2 of 2 positions shown · non-contrast
Comparison: 06/24/2016

CLINICAL DATA: Shortness of breath.  Cough.

EXAM:
CHEST  2 VIEW

[Series 1: dg chest 2 view · 0.14mm/px · 2 of 2 slices shown]
[im 1/2]
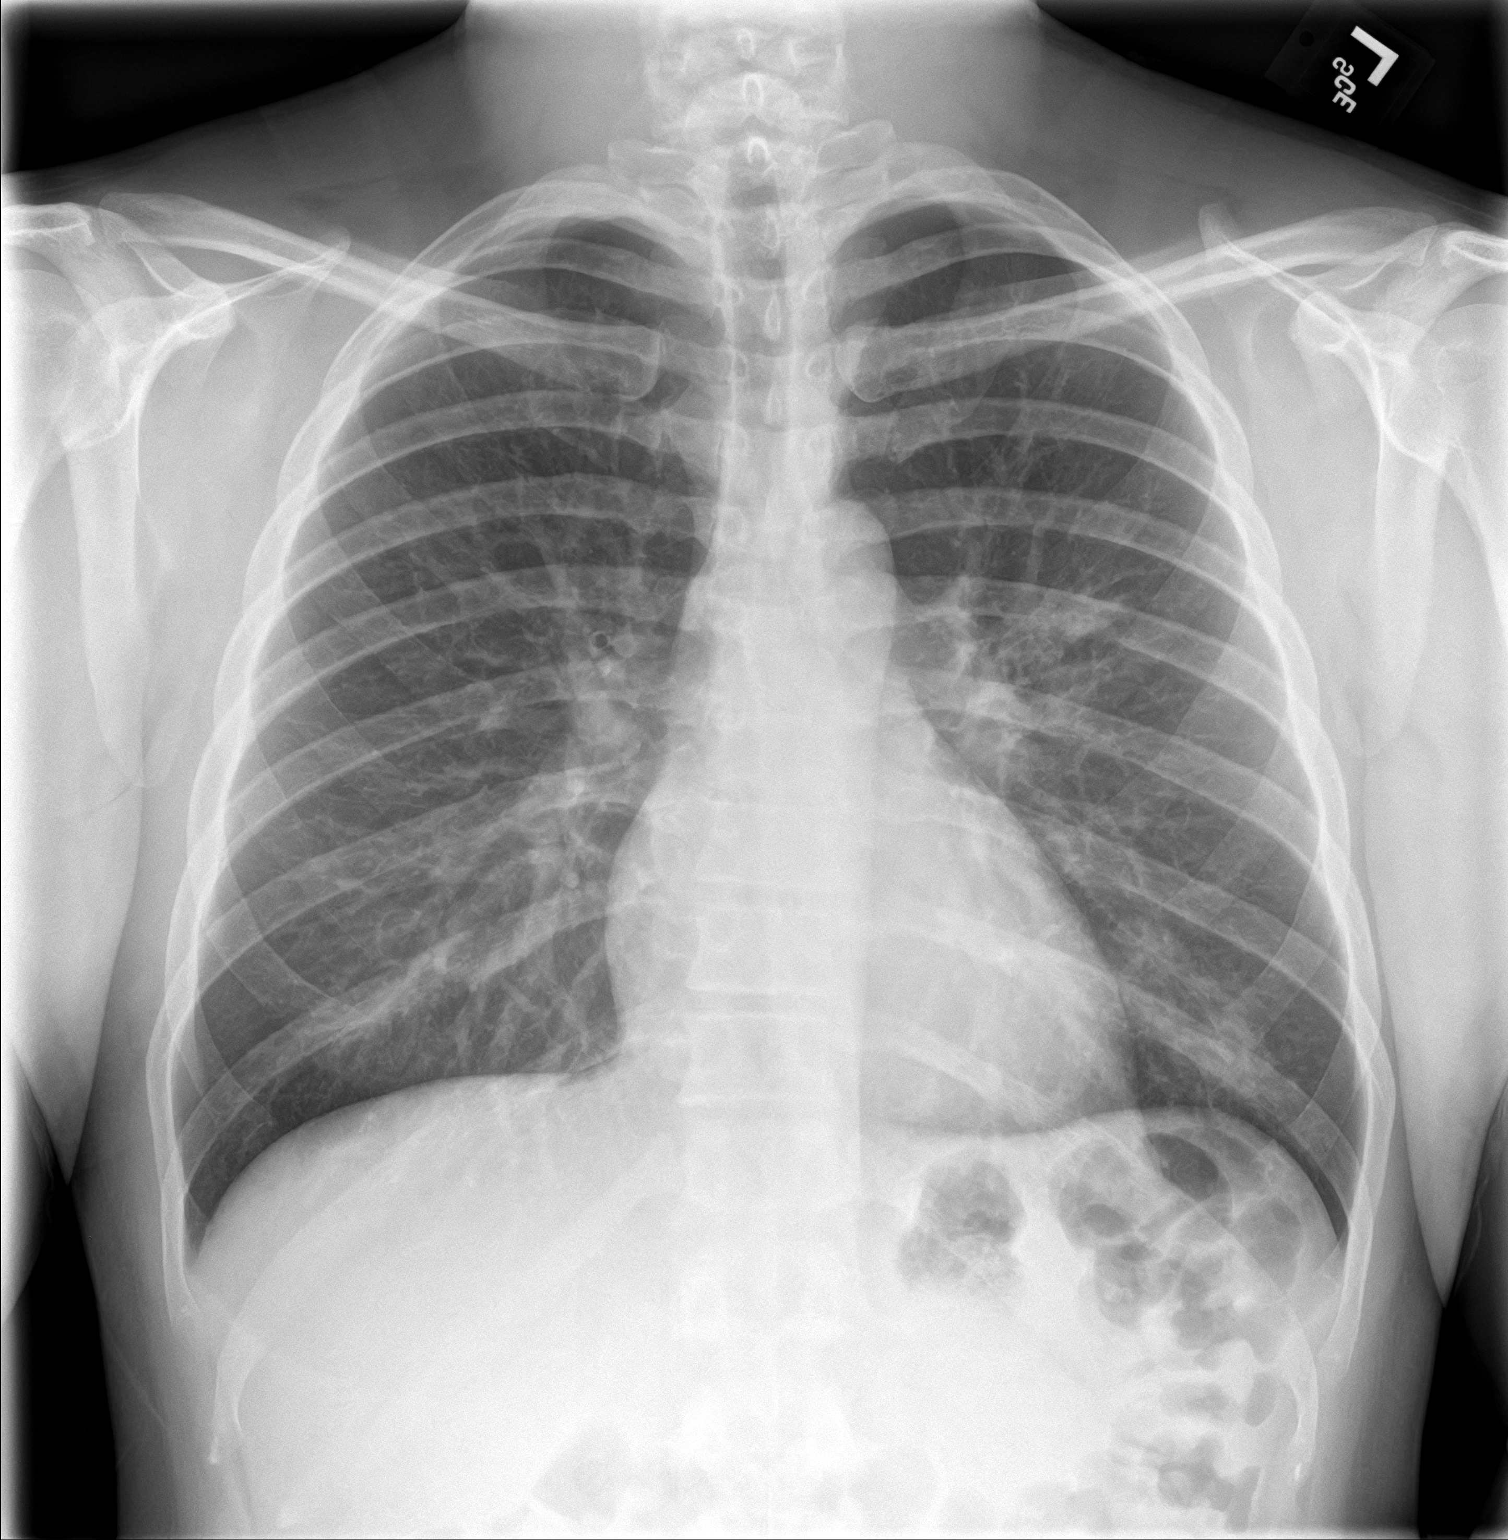
[im 2/2]
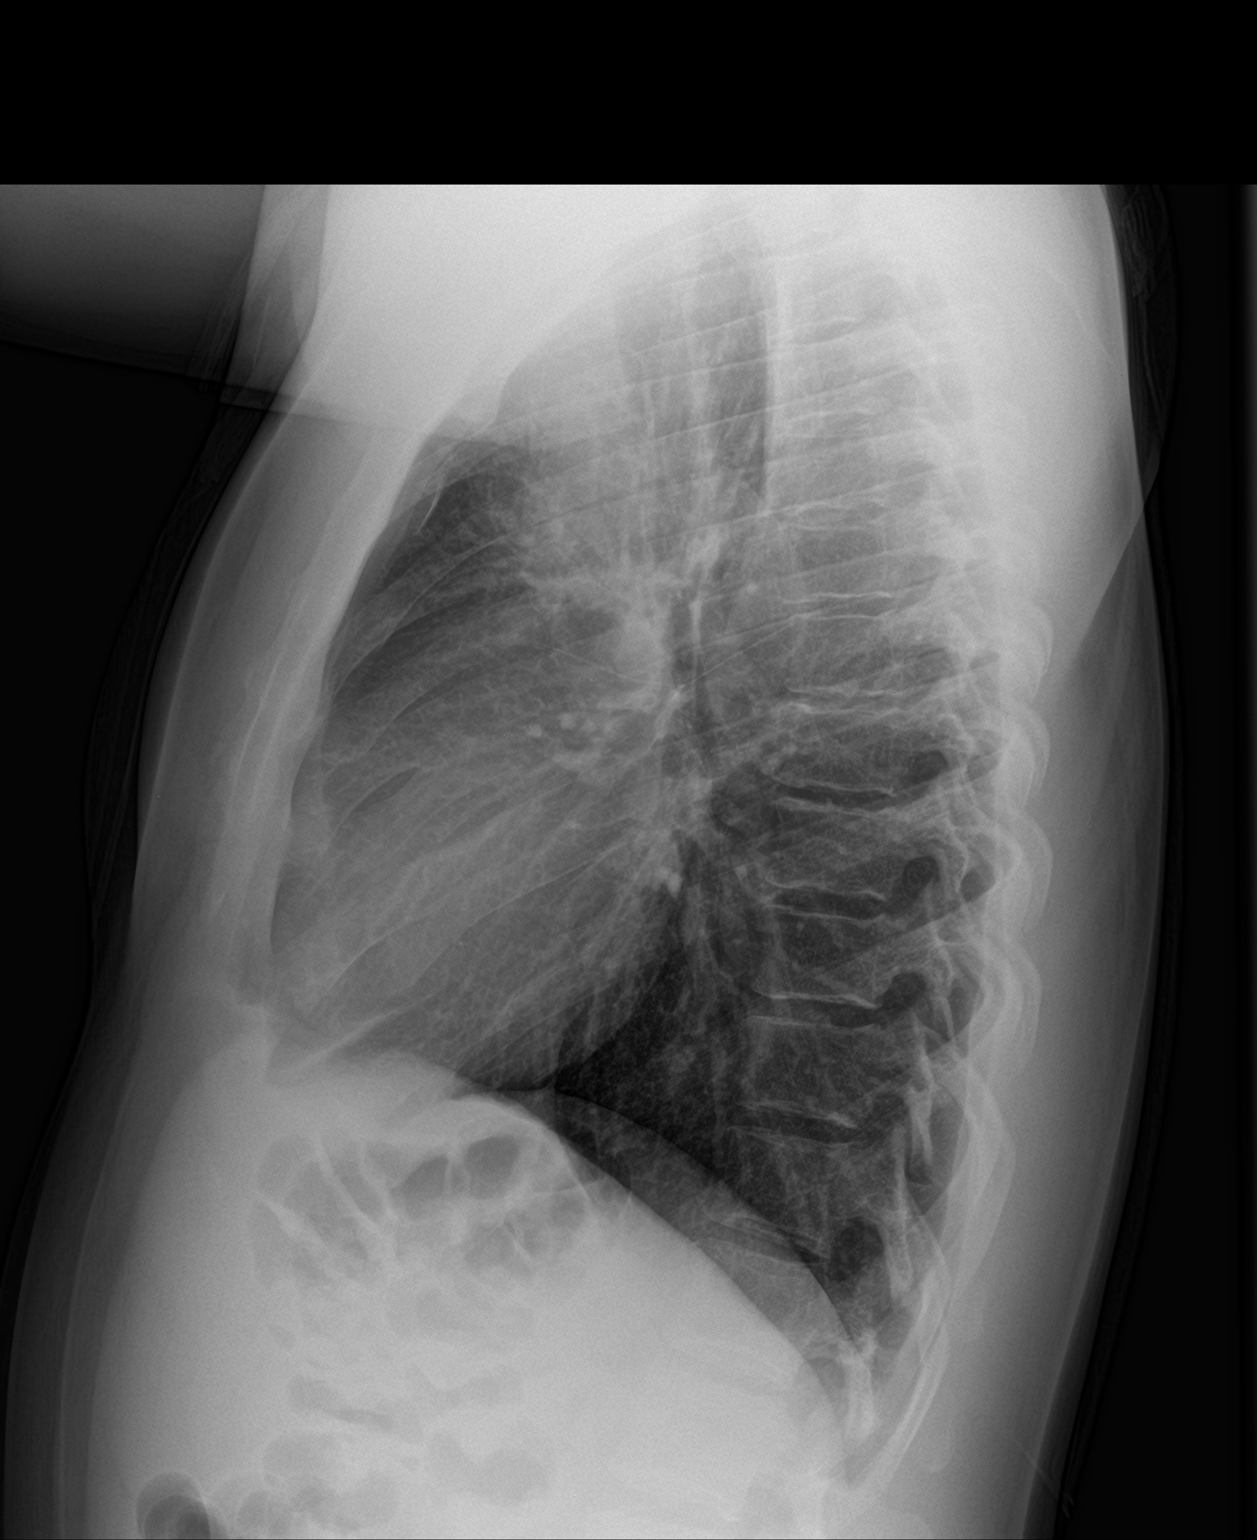

[2 of 2 positions shown; findings below may reference images not displayed]

FINDINGS: Midline trachea. Normal heart size and mediastinal contours. No
pleural effusion or pneumothorax. Moderate pulmonary interstitial
thickening is again identified. Suspect patchy left perihilar and
lower lobe airspace disease, only readily apparent on the frontal
radiograph.
IMPRESSION: Suspect subtle left-sided airspace disease/ pneumonia. This is
superimposed upon moderate central airway thickening, likely related
to chronic bronchitis/smoking.

## 2017-08-19 ENCOUNTER — Other Ambulatory Visit: Payer: Self-pay

## 2017-08-19 ENCOUNTER — Encounter: Payer: Self-pay | Admitting: *Deleted

## 2017-08-19 ENCOUNTER — Emergency Department: Payer: Self-pay

## 2017-08-19 DIAGNOSIS — F17210 Nicotine dependence, cigarettes, uncomplicated: Secondary | ICD-10-CM | POA: Insufficient documentation

## 2017-08-19 DIAGNOSIS — R52 Pain, unspecified: Secondary | ICD-10-CM | POA: Insufficient documentation

## 2017-08-19 DIAGNOSIS — Z79899 Other long term (current) drug therapy: Secondary | ICD-10-CM | POA: Insufficient documentation

## 2017-08-19 DIAGNOSIS — J45909 Unspecified asthma, uncomplicated: Secondary | ICD-10-CM | POA: Insufficient documentation

## 2017-08-19 DIAGNOSIS — M545 Low back pain: Secondary | ICD-10-CM | POA: Insufficient documentation

## 2017-08-19 DIAGNOSIS — R112 Nausea with vomiting, unspecified: Secondary | ICD-10-CM | POA: Insufficient documentation

## 2017-08-19 LAB — CBC
HEMATOCRIT: 47.7 % (ref 40.0–52.0)
Hemoglobin: 15.7 g/dL (ref 13.0–18.0)
MCH: 29.9 pg (ref 26.0–34.0)
MCHC: 33 g/dL (ref 32.0–36.0)
MCV: 90.6 fL (ref 80.0–100.0)
PLATELETS: 303 10*3/uL (ref 150–440)
RBC: 5.27 MIL/uL (ref 4.40–5.90)
RDW: 13.6 % (ref 11.5–14.5)
WBC: 8.8 10*3/uL (ref 3.8–10.6)

## 2017-08-19 LAB — URINALYSIS, COMPLETE (UACMP) WITH MICROSCOPIC
BACTERIA UA: NONE SEEN
BILIRUBIN URINE: NEGATIVE
Glucose, UA: NEGATIVE mg/dL
Hgb urine dipstick: NEGATIVE
Ketones, ur: NEGATIVE mg/dL
Leukocytes, UA: NEGATIVE
NITRITE: NEGATIVE
PH: 6 (ref 5.0–8.0)
Protein, ur: NEGATIVE mg/dL
RBC / HPF: NONE SEEN RBC/hpf (ref 0–5)
SPECIFIC GRAVITY, URINE: 1.011 (ref 1.005–1.030)

## 2017-08-19 LAB — BASIC METABOLIC PANEL
Anion gap: 6 (ref 5–15)
BUN: 11 mg/dL (ref 6–20)
CHLORIDE: 99 mmol/L — AB (ref 101–111)
CO2: 29 mmol/L (ref 22–32)
CREATININE: 0.78 mg/dL (ref 0.61–1.24)
Calcium: 8.9 mg/dL (ref 8.9–10.3)
GFR calc Af Amer: >60 mL/min (ref >60)
GFR calc non Af Amer: >60 mL/min (ref >60)
GLUCOSE: 97 mg/dL (ref 65–99)
POTASSIUM: 3.5 mmol/L (ref 3.5–5.1)
SODIUM: 134 mmol/L — AB (ref 135–145)

## 2017-08-19 LAB — TROPONIN I: Troponin I: <0.03 ng/mL (ref <0.03)

## 2017-08-19 NOTE — ED Triage Notes (Signed)
Pt to ED reporting generalized body aches and weakness for the past couple weeks. Lower back pain with "darker colored" urine reported as well. NV has been intermittent in nature with one episode of vomiting today. Pt also reports feeling as though his heart is fluttering intermittently with SOB upon exertion. Cold chills reported but no reports of a fever.

## 2017-08-20 ENCOUNTER — Emergency Department
Admission: EM | Admit: 2017-08-20 | Discharge: 2017-08-20 | Disposition: A | Discharge status: Home / Self Care | Payer: Self-pay | Attending: Emergency Medicine | Admitting: Emergency Medicine

## 2017-08-20 DIAGNOSIS — M545 Low back pain, unspecified: Secondary | ICD-10-CM

## 2017-08-20 DIAGNOSIS — R110 Nausea: Secondary | ICD-10-CM

## 2017-08-20 MED ORDER — IBUPROFEN 600 MG PO TABS
600.0000 mg | ORAL_TABLET | Freq: Once | ORAL | Status: AC
  Administered 2017-08-20: 600 mg via ORAL
  Filled 2017-08-20: qty 1

## 2017-08-20 MED ORDER — ONDANSETRON HCL 4 MG PO TABS: 4.0000 mg | ORAL_TABLET | Freq: Every day | ORAL | 0 refills | Status: AC | PRN

## 2017-08-20 MED ORDER — ONDANSETRON 4 MG PO TBDP
4.0000 mg | ORAL_TABLET | Freq: Once | ORAL | Status: AC
  Administered 2017-08-20: 4 mg via ORAL
  Filled 2017-08-20: qty 1

## 2017-08-20 MED ORDER — IBUPROFEN 600 MG PO TABS: 600.0000 mg | ORAL_TABLET | Freq: Three times a day (TID) | ORAL | 0 refills | Status: AC | PRN

## 2017-08-20 NOTE — ED Provider Notes (Signed)
[facility] Emergency Department Provider Note  ____________________________________________   First MD Initiated Contact with Patient 08/20/17 [phone]     (approximate)  I have reviewed the triage vital signs and the nursing notes.   HISTORY  Chief Complaint Emesis; Generalized Body Aches; and Back Pain    HPI Roger Hall is a 32 y.o. male with an acute exacerbation of 1 month of symptoms.  His symptoms are primarily now throbbing aching low back pain associated with nausea.  He also feels like he may be dehydrated and his urine is "dark".  He has some nausea and vomiting.  Nothing seems to make it better or worse in particular.  He feels like he had an upper respiratory tract infection or flu several weeks ago.  Past Medical History:  Diagnosis Date  . Asthma   . Depression     Patient Active Problem List   Diagnosis Date Noted  . Stimulant use disorder 01/11/2017  . Tobacco use disorder 07/29/2016  . PTSD (post-traumatic stress disorder) 07/29/2016  . Alcohol use disorder, moderate, dependence (HCC) 07/29/2016  . Bipolar I disorder, most recent episode depressed, severe without psychotic features (HCC) 07/29/2016    History reviewed. No pertinent surgical history.  Prior to Admission medications   Medication Sig Start Date End Date Taking? Authorizing Provider  albuterol (PROVENTIL HFA;VENTOLIN HFA) 108 (90 Base) MCG/ACT inhaler Inhale 2 puffs into the lungs every 6 (six) hours as needed for wheezing or shortness of breath. 11/10/16   Enid Derry, PA-C  ARIPiprazole (ABILIFY) 10 MG tablet Take 1 tablet (10 mg total) by mouth daily. 01/15/17   Pucilowska, Braulio Conte B, MD  ibuprofen (ADVIL,MOTRIN) 600 MG tablet Take 1 tablet (600 mg total) by mouth every 8 (eight) hours as needed. 08/20/17   Merrily Brittle, MD  ondansetron (ZOFRAN) 4 MG tablet Take 1 tablet (4 mg total) by mouth daily as needed. 08/20/17 08/20/18  Merrily Brittle, MD    Oxcarbazepine (TRILEPTAL) 300 MG tablet Take 1 tablet (300 mg total) by mouth 2 (two) times daily. 01/15/17   Pucilowska, Braulio Conte B, MD  traZODone (DESYREL) 100 MG tablet Take 1 tablet (100 mg total) by mouth at bedtime. 01/15/17   Pucilowska, Ellin Goodie, MD    Allergies Patient has no known allergies.  Family History  Problem Relation Age of Onset  . Depression Mother     Social History Social History   Tobacco Use  . Smoking status: Current Every Day Smoker    Packs/day: 0.50    Years: 10.00    Pack years: 5.00    Types: Cigarettes  . Smokeless tobacco: Never Used  Substance Use Topics  . Alcohol use: Yes    Alcohol/week: 3.6 oz    Types: 6 Cans of beer per week    Comment: daily  . Drug use: Not on file    Review of Systems Constitutional: No fever/chills Eyes: No visual changes. ENT: No sore throat. Cardiovascular: Denies chest pain. Respiratory: Denies shortness of breath. Gastrointestinal: Positive for abdominal pain.  Positive for nausea, positive for vomiting.  No diarrhea.  No constipation. Genitourinary: Negative for dysuria. Musculoskeletal: Positive for back pain. Skin: Negative for rash. Neurological: Negative for headaches, focal weakness or numbness.   ____________________________________________   PHYSICAL EXAM:  VITAL SIGNS: ED Triage Vitals  Enc Vitals Group     BP 08/19/17 1848 105/73     Pulse Rate 08/19/17 1848 86     Resp 08/19/17 1848 16  Temp 08/19/17 1848 98.2 F (36.8 C)     Temp Source 08/19/17 1848 Oral     SpO2 08/19/17 1848 98 %     Weight 08/19/17 1849 160 lb (72.6 kg)     Height 08/19/17 1849 5\' 4"  (1.626 m)     Head Circumference --      Peak Flow --      Pain Score 08/19/17 1848 8     Pain Loc --      Pain Edu? --      Excl. in GC? --     Constitutional: Alert and oriented x4 nontoxic no diaphoresis speaks in full clear sentences Eyes: PERRL EOMI. Head: Atraumatic. Nose: No congestion/rhinnorhea. Mouth/Throat:  No trismus uvula midline no pharyngeal erythema or exudate Neck: No stridor.   Cardiovascular: Normal rate, regular rhythm. Grossly normal heart sounds.  Good peripheral circulation. Respiratory: Normal respiratory effort.  No retractions. Lungs CTAB and moving good air Gastrointestinal: Soft nondistended nontender no rebound or guarding no peritonitis no McBurney tenderness negative Rovsing's no costovertebral tenderness Musculoskeletal: No lower extremity edema   Neurologic:  Normal speech and language. No gross focal neurologic deficits are appreciated. Skin:  Skin is warm, dry and intact. No rash noted.  ____________________________________________   DIFFERENTIAL includes but not limited to  Post viral syndrome, upper respiratory tract infection, nephrolithiasis, rhabdomyolysis ____________________________________________   LABS (all labs ordered are listed, but only abnormal results are displayed)  Labs Reviewed  BASIC METABOLIC PANEL - Abnormal; Notable for the following components:      Result Value   Sodium 134 (*)    Chloride 99 (*)    All other components within normal limits  URINALYSIS, COMPLETE (UACMP) WITH MICROSCOPIC - Abnormal; Notable for the following components:   Color, Urine YELLOW (*)    APPearance CLEAR (*)    Squamous Epithelial / LPF 0-5 (*)    All other components within normal limits  CBC  TROPONIN I    Lab work reviewed by me with no acute disease __________________________________________  EKG  ED ECG REPORT I, Merrily Brittle, the attending physician, personally viewed and interpreted this ECG.  Date: 08/20/2017 EKG Time:  Rate: 72 Rhythm: normal sinus rhythm QRS Axis: Rightward axis unchanged from previous EKG one year ago Intervals: normal ST/T Wave abnormalities: normal Narrative Interpretation: no evidence of acute ischemia  ____________________________________________  RADIOLOGY  Chest x-ray reviewed by me with no acute  disease ____________________________________________   PROCEDURES  Procedure(s) performed: no  Procedures  Critical Care performed: o  Observation: no ____________________________________________   INITIAL IMPRESSION / ASSESSMENT AND PLAN / ED COURSE  Pertinent labs & imaging results that were available during my care of the patient were reviewed by me and considered in my medical decision making (see chart for details).  The patient arrives hemodynamically stable and well-appearing.  Lab work is reassuring.  Urinalysis with no blood and not suggestive of rhabdomyolysis.  I had a discussion with the patient regarding the diagnostic uncertainty and that he is likely suffering from post viral syndrome.  He feels improved after nonsteroidals.  Will be discharged home with primary care follow-up and strict return precautions.  The patient verbalizes understanding and agree with the plan.      ____________________________________________   FINAL CLINICAL IMPRESSION(S) / ED DIAGNOSES  Final diagnoses:  Low back pain without sciatica, unspecified back pain laterality, unspecified chronicity  Nausea      NEW MEDICATIONS STARTED DURING THIS VISIT:  Discharge Medication List as of  08/20/2017 12:52 AM    START taking these medications   Details  ibuprofen (ADVIL,MOTRIN) 600 MG tablet Take 1 tablet (600 mg total) by mouth every 8 (eight) hours as needed., Starting Wed 08/20/2017, Print    ondansetron (ZOFRAN) 4 MG tablet Take 1 tablet (4 mg total) by mouth daily as needed., Starting Wed 08/20/2017, Until Thu 08/20/2018, Print         Note:  This document was prepared using Dragon voice recognition software and may include unintentional dictation errors.     Merrily Brittleifenbark, Andreina Outten, MD 08/20/17 [phone]

## 2017-08-20 NOTE — Discharge Instructions (Signed)
Fortunately today your blood work, your EKG, and your x-ray were very reassuring.  Please take pain and nausea medication as needed for severe symptoms and make an appointment to establish care with a primary care physician within 1 week for reevaluation.  Return to the emergency department sooner for any concerns whatsoever.  It was a pleasure to take care of you today, and thank you for coming to our emergency department.  If you have any questions or concerns before leaving please ask the nurse to grab me and I'm more than happy to go through your aftercare instructions again.  If you were prescribed any opioid pain medication today such as Norco, Vicodin, Percocet, morphine, hydrocodone, or oxycodone please make sure you do not drive when you are taking this medication as it can alter your ability to drive safely.  If you have any concerns once you are home that you are not improving or are in fact getting worse before you can make it to your follow-up appointment, please do not hesitate to call 911 and come back for further evaluation.  Merrily BrittleNeil Darien Mignogna, MD  Results for orders placed or performed during the hospital encounter of 08/20/17  Basic metabolic panel  Result Value Ref Range   Sodium 134 (L) 135 - 145 mmol/L   Potassium 3.5 3.5 - 5.1 mmol/L   Chloride 99 (L) 101 - 111 mmol/L   CO2 29 22 - 32 mmol/L   Glucose, Bld 97 65 - 99 mg/dL   BUN 11 6 - 20 mg/dL   Creatinine, Ser 1.470.78 0.61 - 1.24 mg/dL   Calcium 8.9 8.9 - 82.910.3 mg/dL   GFR calc non Af Amer >60 >60 mL/min   GFR calc Af Amer >60 >60 mL/min   Anion gap 6 5 - 15  CBC  Result Value Ref Range   WBC 8.8 3.8 - 10.6 K/uL   RBC 5.27 4.40 - 5.90 MIL/uL   Hemoglobin 15.7 13.0 - 18.0 g/dL   HCT 56.247.7 13.040.0 - 86.552.0 %   MCV 90.6 80.0 - 100.0 fL   MCH 29.9 26.0 - 34.0 pg   MCHC 33.0 32.0 - 36.0 g/dL   RDW 78.413.6 69.611.5 - 29.514.5 %   Platelets 303 150 - 440 K/uL  Troponin I  Result Value Ref Range   Troponin I <0.03 <0.03 ng/mL    Urinalysis, Complete w Microscopic  Result Value Ref Range   Color, Urine YELLOW (A) YELLOW   APPearance CLEAR (A) CLEAR   Specific Gravity, Urine 1.011 1.005 - 1.030   pH 6.0 5.0 - 8.0   Glucose, UA NEGATIVE NEGATIVE mg/dL   Hgb urine dipstick NEGATIVE NEGATIVE   Bilirubin Urine NEGATIVE NEGATIVE   Ketones, ur NEGATIVE NEGATIVE mg/dL   Protein, ur NEGATIVE NEGATIVE mg/dL   Nitrite NEGATIVE NEGATIVE   Leukocytes, UA NEGATIVE NEGATIVE   RBC / HPF NONE SEEN 0 - 5 RBC/hpf   WBC, UA 0-5 0 - 5 WBC/hpf   Bacteria, UA NONE SEEN NONE SEEN   Squamous Epithelial / LPF 0-5 (A) NONE SEEN   Dg Chest 2 View  Result Date: 08/19/2017 CLINICAL DATA:  Cardiac arrhythmia EXAM: CHEST - 2 VIEW COMPARISON:  November 10, 2016 FINDINGS: Lungs are clear. Heart size and pulmonary vascularity are normal. No adenopathy. No bone lesions. No pneumothorax. IMPRESSION: No edema or consolidation. Electronically Signed   By: Bretta BangWilliam  Woodruff III M.D.   On: 08/19/2017 19:13

## 2018-05-19 IMAGING — CR DG CHEST 2V
1 series · 2 of 2 positions shown · non-contrast
Comparison: November 10, 2016

CLINICAL DATA: Cardiac arrhythmia

EXAM:
CHEST - 2 VIEW

[Series 1: dg chest 2 view · 0.14mm/px · 2 of 2 slices shown]
[im 1/2]
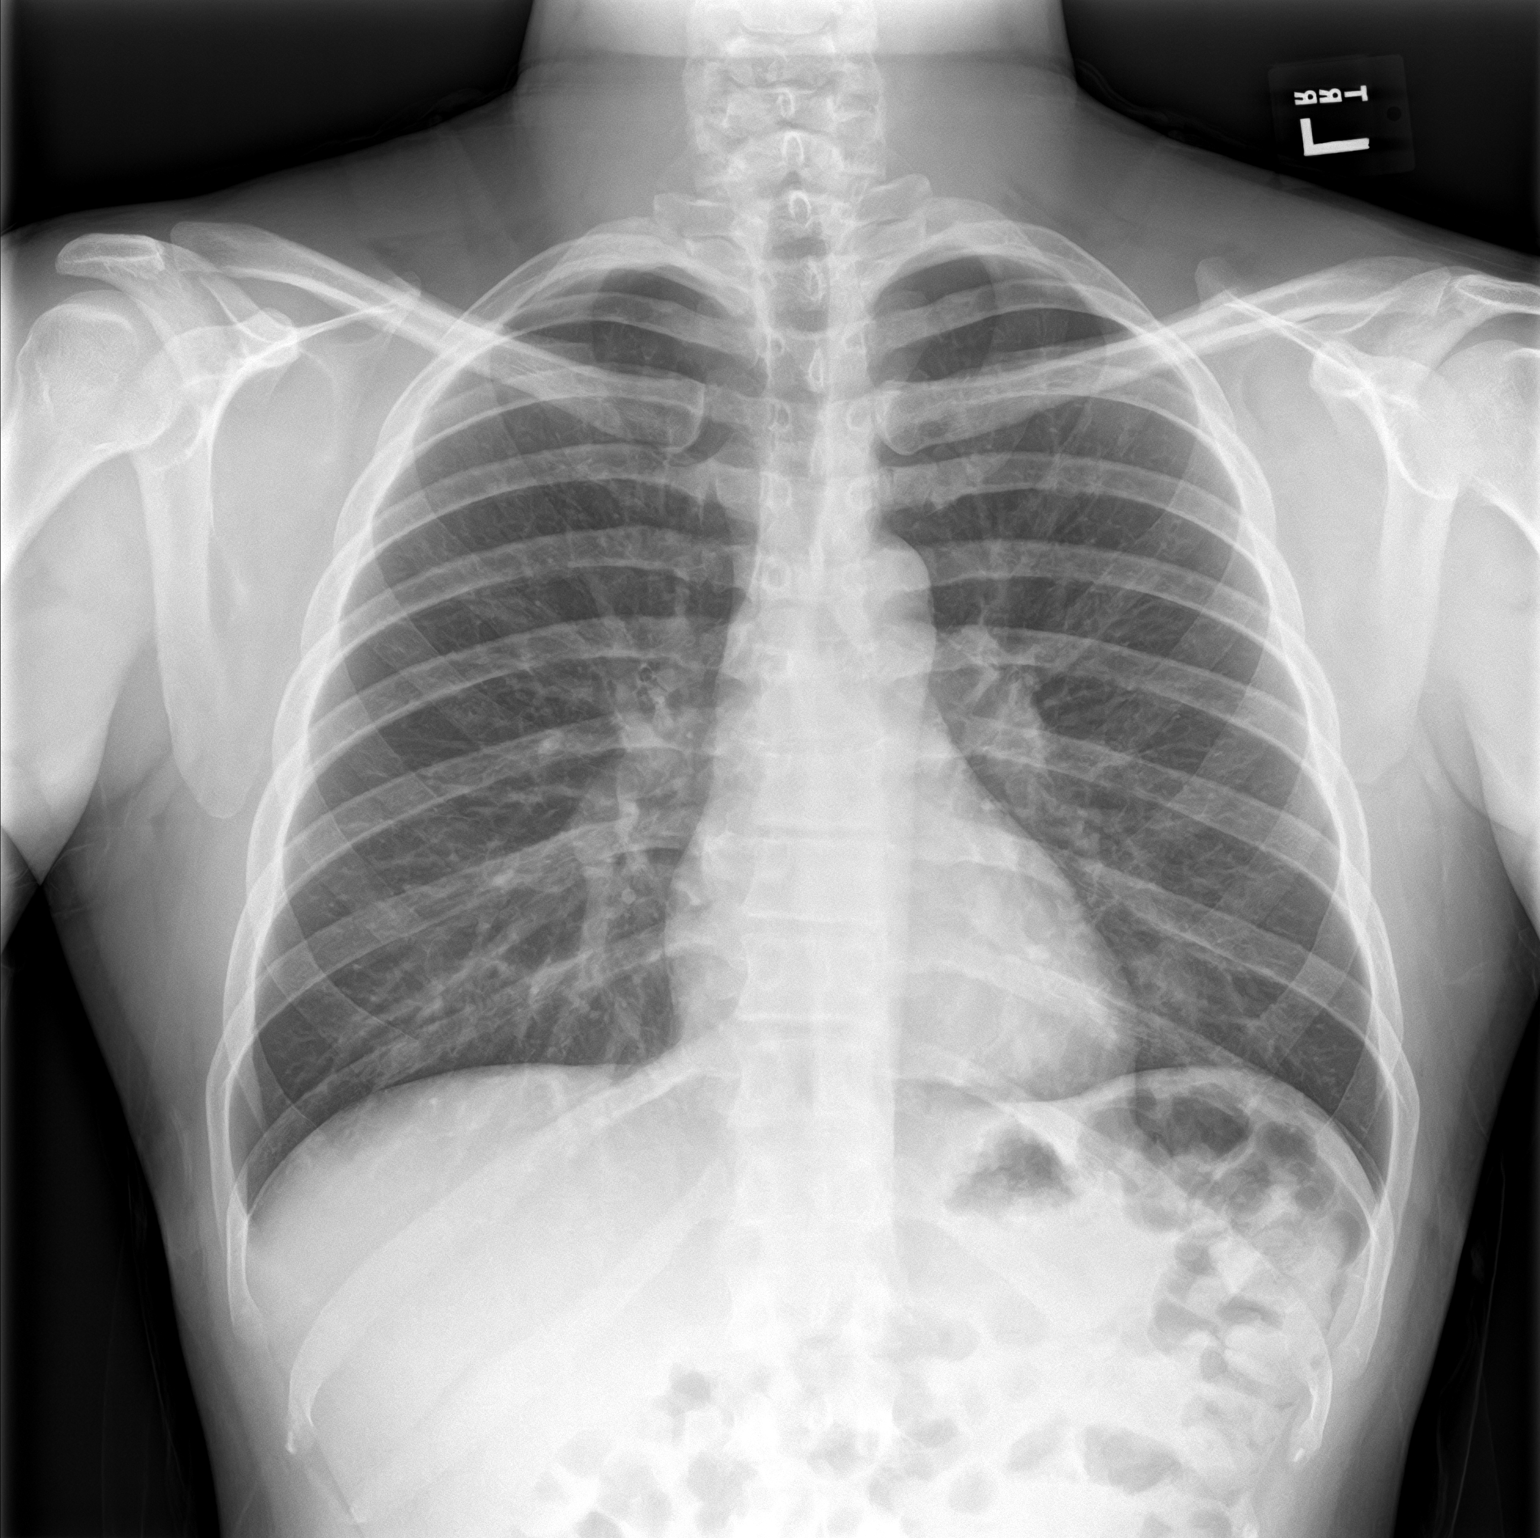
[im 2/2]
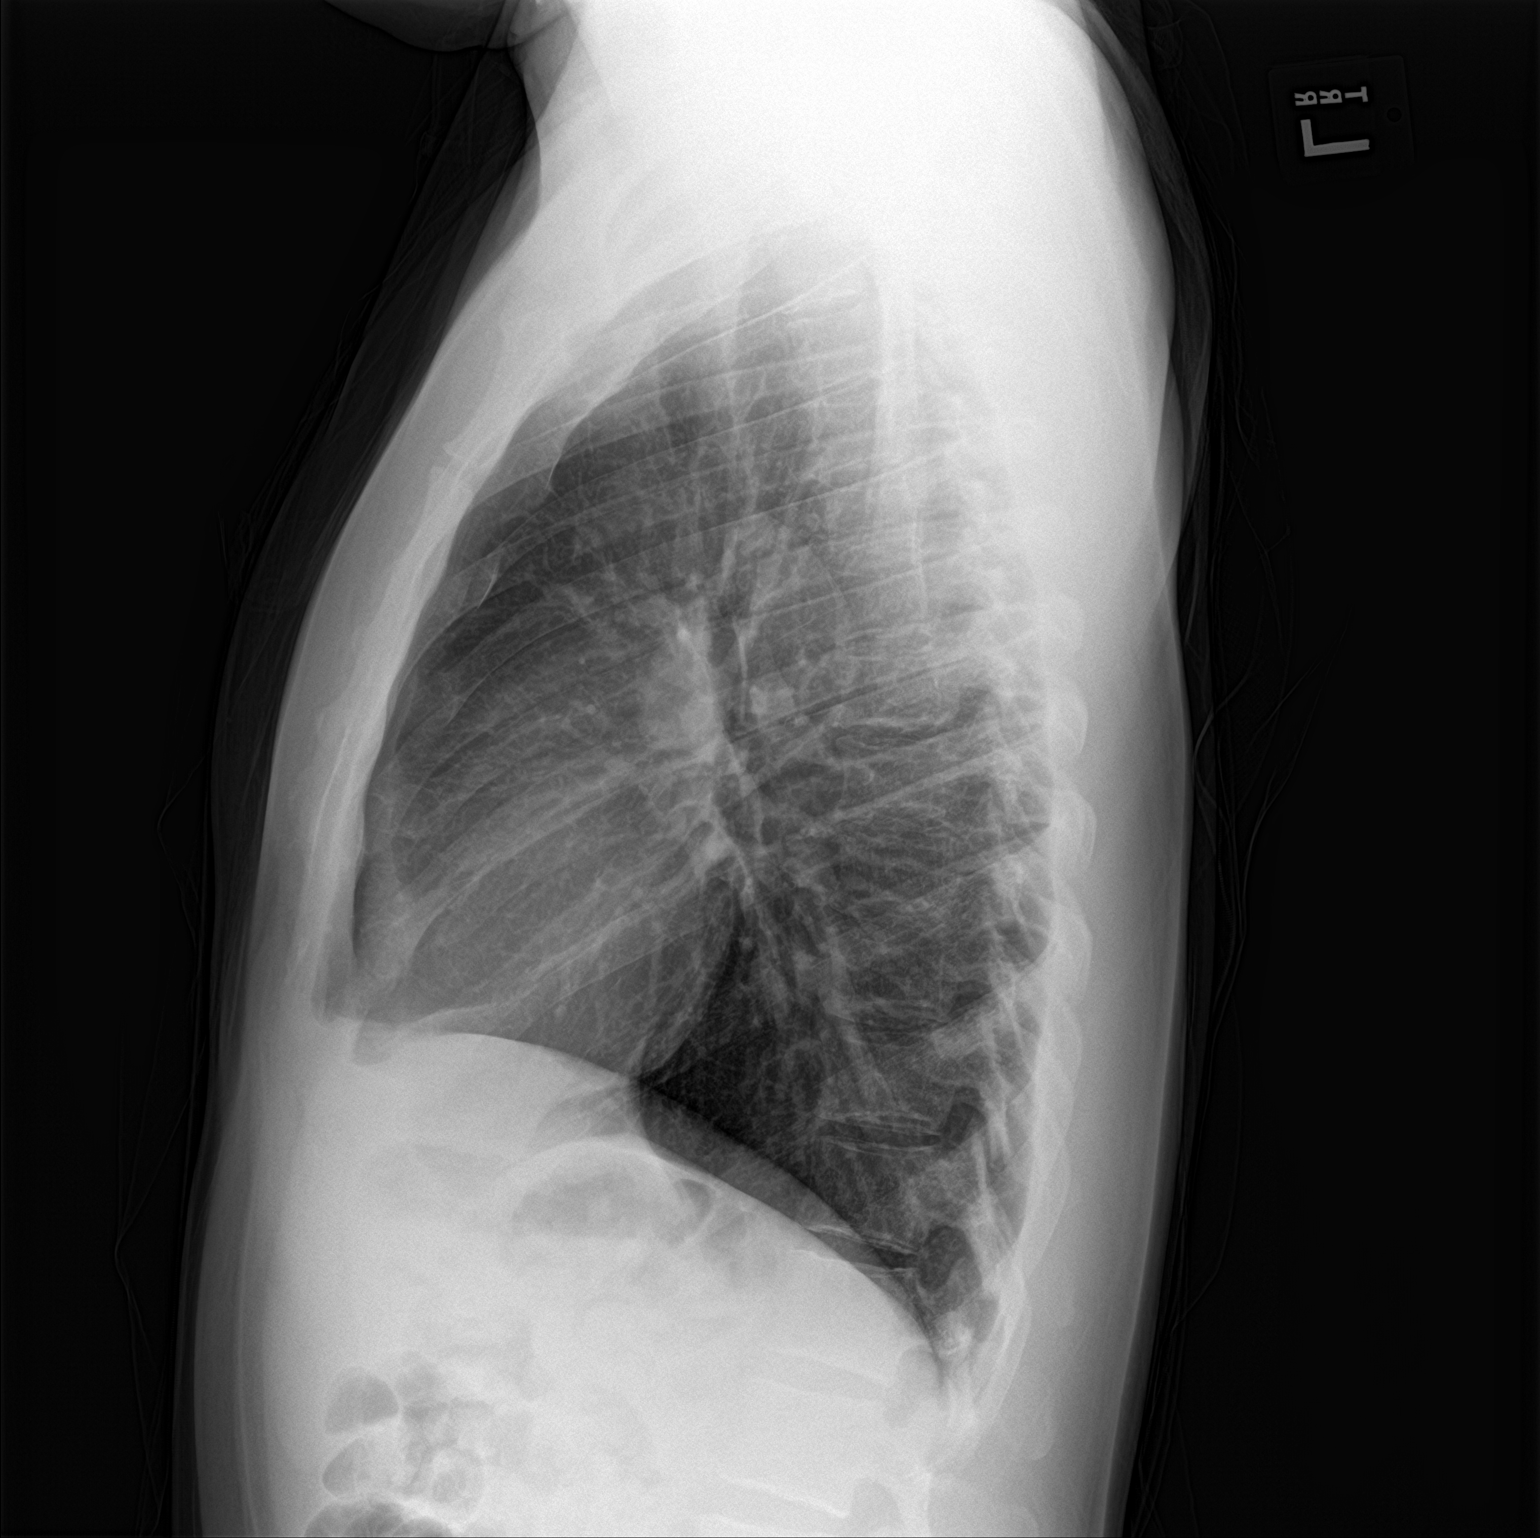

[2 of 2 positions shown; findings below may reference images not displayed]

FINDINGS: Lungs are clear. Heart size and pulmonary vascularity are normal. No
adenopathy. No bone lesions. No pneumothorax.
IMPRESSION: No edema or consolidation.
